# Patient Record
Sex: Male | Born: 1995
Health system: Southern US, Community
[De-identification: ages and names within clinical notes are randomized; demographics above are authoritative.]

## PROBLEM LIST (undated history)

## (undated) DIAGNOSIS — J189 Pneumonia, unspecified organism: Secondary | ICD-10-CM

## (undated) DIAGNOSIS — F319 Bipolar disorder, unspecified: Secondary | ICD-10-CM

## (undated) HISTORY — PX: WISDOM TOOTH EXTRACTION: SHX21

---
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
## 2016-02-28 ENCOUNTER — Encounter: Payer: BLUE CROSS/BLUE SHIELD | Admitting: Sports Medicine

## 2016-03-27 ENCOUNTER — Encounter: Payer: Self-pay | Admitting: Internal Medicine

## 2016-04-09 DIAGNOSIS — M436 Torticollis: Secondary | ICD-10-CM | POA: Diagnosis not present

## 2016-04-09 DIAGNOSIS — Z23 Encounter for immunization: Secondary | ICD-10-CM | POA: Diagnosis not present

## 2016-04-15 DIAGNOSIS — Z1389 Encounter for screening for other disorder: Secondary | ICD-10-CM | POA: Diagnosis not present

## 2016-04-15 DIAGNOSIS — Z0000 Encounter for general adult medical examination without abnormal findings: Secondary | ICD-10-CM | POA: Diagnosis not present

## 2016-04-15 DIAGNOSIS — Z6823 Body mass index (BMI) 23.0-23.9, adult: Secondary | ICD-10-CM | POA: Diagnosis not present

## 2016-04-15 DIAGNOSIS — E559 Vitamin D deficiency, unspecified: Secondary | ICD-10-CM | POA: Diagnosis not present

## 2016-04-16 DIAGNOSIS — Z1283 Encounter for screening for malignant neoplasm of skin: Secondary | ICD-10-CM | POA: Diagnosis not present

## 2016-04-16 DIAGNOSIS — D225 Melanocytic nevi of trunk: Secondary | ICD-10-CM | POA: Diagnosis not present

## 2016-04-16 DIAGNOSIS — D485 Neoplasm of uncertain behavior of skin: Secondary | ICD-10-CM | POA: Diagnosis not present

## 2016-09-09 DIAGNOSIS — K529 Noninfective gastroenteritis and colitis, unspecified: Secondary | ICD-10-CM | POA: Diagnosis not present

## 2016-09-09 DIAGNOSIS — R1032 Left lower quadrant pain: Secondary | ICD-10-CM | POA: Diagnosis not present

## 2016-09-09 DIAGNOSIS — R1013 Epigastric pain: Secondary | ICD-10-CM | POA: Diagnosis not present

## 2016-09-09 DIAGNOSIS — R509 Fever, unspecified: Secondary | ICD-10-CM | POA: Diagnosis not present

## 2016-10-15 DIAGNOSIS — N4889 Other specified disorders of penis: Secondary | ICD-10-CM | POA: Diagnosis not present

## 2016-10-27 DIAGNOSIS — F4323 Adjustment disorder with mixed anxiety and depressed mood: Secondary | ICD-10-CM | POA: Diagnosis not present

## 2016-11-12 DIAGNOSIS — D485 Neoplasm of uncertain behavior of skin: Secondary | ICD-10-CM | POA: Diagnosis not present

## 2017-01-12 DIAGNOSIS — R45851 Suicidal ideations: Secondary | ICD-10-CM | POA: Diagnosis not present

## 2017-01-12 DIAGNOSIS — F329 Major depressive disorder, single episode, unspecified: Secondary | ICD-10-CM | POA: Diagnosis not present

## 2017-01-13 DIAGNOSIS — R45851 Suicidal ideations: Secondary | ICD-10-CM | POA: Diagnosis not present

## 2017-01-13 DIAGNOSIS — F331 Major depressive disorder, recurrent, moderate: Secondary | ICD-10-CM | POA: Diagnosis not present

## 2017-01-13 DIAGNOSIS — F419 Anxiety disorder, unspecified: Secondary | ICD-10-CM | POA: Diagnosis not present

## 2017-01-14 DIAGNOSIS — R45851 Suicidal ideations: Secondary | ICD-10-CM | POA: Diagnosis not present

## 2017-01-14 DIAGNOSIS — F331 Major depressive disorder, recurrent, moderate: Secondary | ICD-10-CM | POA: Diagnosis not present

## 2017-01-15 DIAGNOSIS — R45851 Suicidal ideations: Secondary | ICD-10-CM | POA: Diagnosis not present

## 2017-01-15 DIAGNOSIS — F331 Major depressive disorder, recurrent, moderate: Secondary | ICD-10-CM | POA: Diagnosis not present

## 2017-01-16 DIAGNOSIS — R45851 Suicidal ideations: Secondary | ICD-10-CM | POA: Diagnosis not present

## 2017-01-16 DIAGNOSIS — F331 Major depressive disorder, recurrent, moderate: Secondary | ICD-10-CM | POA: Diagnosis not present

## 2017-01-28 DIAGNOSIS — F319 Bipolar disorder, unspecified: Secondary | ICD-10-CM | POA: Diagnosis not present

## 2017-01-30 DIAGNOSIS — F329 Major depressive disorder, single episode, unspecified: Secondary | ICD-10-CM | POA: Diagnosis not present

## 2017-01-30 DIAGNOSIS — F419 Anxiety disorder, unspecified: Secondary | ICD-10-CM | POA: Diagnosis not present

## 2017-01-30 DIAGNOSIS — Z6824 Body mass index (BMI) 24.0-24.9, adult: Secondary | ICD-10-CM | POA: Diagnosis not present

## 2017-01-30 DIAGNOSIS — T1491XS Suicide attempt, sequela: Secondary | ICD-10-CM | POA: Diagnosis not present

## 2017-03-10 ENCOUNTER — Ambulatory Visit (HOSPITAL_COMMUNITY): Payer: BLUE CROSS/BLUE SHIELD | Admitting: Psychiatry

## 2017-03-29 ENCOUNTER — Emergency Department (HOSPITAL_COMMUNITY)
Admission: EM | Admit: 2017-03-29 | Discharge: 2017-03-29 | Disposition: A | Discharge status: Home / Self Care | Payer: BLUE CROSS/BLUE SHIELD | Attending: Emergency Medicine | Admitting: Emergency Medicine

## 2017-03-29 ENCOUNTER — Emergency Department (HOSPITAL_COMMUNITY): Payer: BLUE CROSS/BLUE SHIELD

## 2017-03-29 ENCOUNTER — Encounter (HOSPITAL_COMMUNITY): Payer: Self-pay | Admitting: Emergency Medicine

## 2017-03-29 DIAGNOSIS — K2100 Gastro-esophageal reflux disease with esophagitis, without bleeding: Secondary | ICD-10-CM

## 2017-03-29 DIAGNOSIS — R1010 Upper abdominal pain, unspecified: Secondary | ICD-10-CM | POA: Diagnosis not present

## 2017-03-29 DIAGNOSIS — R197 Diarrhea, unspecified: Secondary | ICD-10-CM

## 2017-03-29 DIAGNOSIS — R1012 Left upper quadrant pain: Secondary | ICD-10-CM

## 2017-03-29 DIAGNOSIS — R1013 Epigastric pain: Secondary | ICD-10-CM | POA: Diagnosis not present

## 2017-03-29 DIAGNOSIS — Z79899 Other long term (current) drug therapy: Secondary | ICD-10-CM | POA: Insufficient documentation

## 2017-03-29 DIAGNOSIS — K219 Gastro-esophageal reflux disease without esophagitis: Secondary | ICD-10-CM | POA: Diagnosis not present

## 2017-03-29 DIAGNOSIS — R112 Nausea with vomiting, unspecified: Secondary | ICD-10-CM

## 2017-03-29 LAB — URINALYSIS, ROUTINE W REFLEX MICROSCOPIC
Bilirubin Urine: NEGATIVE
Glucose, UA: NEGATIVE mg/dL
Ketones, ur: NEGATIVE mg/dL
Leukocytes, UA: NEGATIVE
Nitrite: NEGATIVE
Protein, ur: NEGATIVE mg/dL
Specific Gravity, Urine: 1.006 (ref 1.005–1.030)
Squamous Epithelial / LPF: NONE SEEN
pH: 7 (ref 5.0–8.0)

## 2017-03-29 LAB — COMPREHENSIVE METABOLIC PANEL
ALT: 32 U/L (ref 17–63)
AST: 55 U/L — ABNORMAL HIGH (ref 15–41)
Albumin: 4.8 g/dL (ref 3.5–5.0)
Alkaline Phosphatase: 75 U/L (ref 38–126)
Anion gap: 8 (ref 5–15)
BUN: 8 mg/dL (ref 6–20)
CO2: 27 mmol/L (ref 22–32)
Calcium: 9.7 mg/dL (ref 8.9–10.3)
Chloride: 103 mmol/L (ref 101–111)
Creatinine, Ser: 0.84 mg/dL (ref 0.61–1.24)
GFR calc Af Amer: >60 mL/min (ref >60)
GFR calc non Af Amer: >60 mL/min (ref >60)
Glucose, Bld: 110 mg/dL — ABNORMAL HIGH (ref 65–99)
Potassium: 3.6 mmol/L (ref 3.5–5.1)
Sodium: 138 mmol/L (ref 135–145)
Total Bilirubin: 0.9 mg/dL (ref 0.3–1.2)
Total Protein: 7.9 g/dL (ref 6.5–8.1)

## 2017-03-29 LAB — CBC
HCT: 45.6 % (ref 39.0–52.0)
Hemoglobin: 16.6 g/dL (ref 13.0–17.0)
MCH: 30.9 pg (ref 26.0–34.0)
MCHC: 36.4 g/dL — ABNORMAL HIGH (ref 30.0–36.0)
MCV: 84.8 fL (ref 78.0–100.0)
Platelets: 177 10*3/uL (ref 150–400)
RBC: 5.38 MIL/uL (ref 4.22–5.81)
RDW: 12.2 % (ref 11.5–15.5)
WBC: 8 10*3/uL (ref 4.0–10.5)

## 2017-03-29 LAB — LIPASE, BLOOD: Lipase: 20 U/L (ref 11–51)

## 2017-03-29 MED ORDER — ONDANSETRON HCL 4 MG/2ML IJ SOLN
4.0000 mg | Freq: Once | INTRAMUSCULAR | Status: DC
  Filled 2017-03-29: qty 2

## 2017-03-29 MED ORDER — SODIUM CHLORIDE 0.9 % IV BOLUS (SEPSIS)
1000.0000 mL | Freq: Once | INTRAVENOUS | Status: AC
  Administered 2017-03-29: 1000 mL via INTRAVENOUS

## 2017-03-29 MED ORDER — OMEPRAZOLE 20 MG PO CPDR: 20.0000 mg | DELAYED_RELEASE_CAPSULE | Freq: Every day | ORAL | 0 refills | Status: DC

## 2017-03-29 MED ORDER — ONDANSETRON 4 MG PO TBDP: 4.0000 mg | ORAL_TABLET | Freq: Three times a day (TID) | ORAL | 0 refills | Status: DC | PRN

## 2017-03-29 MED ORDER — GI COCKTAIL ~~LOC~~
30.0000 mL | Freq: Once | ORAL | Status: DC
  Filled 2017-03-29: qty 30

## 2017-03-29 NOTE — ED Provider Notes (Signed)
[facility]-EMERGENCY DEPT Provider Note   CSN: [id number] Arrival date & time: 03/29/17  1132     History   Chief Complaint Chief Complaint  Patient presents with  . Abdominal Pain    HPI Keith Guerrero is a 21 y.o. male.  Keith Guerrero is a 21 y.o. Male who presents to the ED complaining of 2 days of epigastric abdominal pain. He reports associated nausea, burping, belching and acid reflux in his mouth. He reports having vomiting some acid from his stomach today. He also reports a loose stool this morning. No hematemesis or hematochezia. No previous abdominal surgeries. No fevers. He denies right upper quadrant abdominal pain. He denies lower abdominal pain. He denies fevers, hematemesis, hematochezia, urinary symptoms, penile pain, testicular pain, back pain, chest pain or shortness of breath.   The history is provided by the patient, a parent and medical records. No language interpreter was used.  Abdominal Pain   Associated symptoms include diarrhea, nausea and vomiting. Pertinent negatives include fever, dysuria, frequency, hematuria and headaches.    Past Medical History:  Diagnosis Date  . Pneumonia     Patient Active Problem List   Diagnosis Date Noted  . Adjustment disorder with anxious mood 06/30/2015  . Chest pain, midsternal 03/22/2015  . Anxiety attack 03/22/2015  . Anxiety 03/22/2015  . Right foot pain 05/18/2014  . Fever, unspecified 05/18/2014  . Viral syndrome 05/17/2014  . BMI (body mass index), pediatric, 5% to less than 85% for age 12/25/2012  . LEG LEGNTH DISCREPANCY 04/13/2007  . PES PLANUS, CONGENITAL 04/13/2007  . DEFORMITY, FOOT NEC, CONGENITAL 04/13/2007    Past Surgical History:  Procedure Laterality Date  . WISDOM TOOTH EXTRACTION         Home Medications    Prior to Admission medications   Medication Sig Start Date End Date Taking? Authorizing Provider  omeprazole (PRILOSEC) 20 MG capsule Take 1 capsule (20 mg total) by mouth daily.  03/29/17   Everlene Farrieransie, Lakeysha Slutsky, PA-C  ondansetron (ZOFRAN ODT) 4 MG disintegrating tablet Take 1 tablet (4 mg total) by mouth every 8 (eight) hours as needed for nausea or vomiting. 03/29/17   Everlene Farrieransie, Jewett Mcgann, PA-C    Family History Family History  Problem Relation Age of Onset  . Hypertension Other     Social History Social History  Substance Use Topics  . Smoking status: Never Smoker  . Smokeless tobacco: Never Used  . Alcohol use No     Allergies   Fish allergy and Other   Review of Systems Review of Systems  Constitutional: Negative for chills and fever.  HENT: Negative for congestion and sore throat.   Eyes: Negative for visual disturbance.  Respiratory: Negative for cough, shortness of breath and wheezing.   Cardiovascular: Negative for chest pain and palpitations.  Gastrointestinal: Positive for abdominal pain, diarrhea, nausea and vomiting. Negative for blood in stool.  Genitourinary: Negative for decreased urine volume, difficulty urinating, dysuria, flank pain, frequency, hematuria, penile pain, testicular pain and urgency.  Musculoskeletal: Negative for back pain and neck pain.  Skin: Negative for rash.  Neurological: Negative for headaches.     Physical Exam Updated Vital Signs BP 124/82 (BP Location: Left Arm)   Pulse 65   Temp 97.8 F (36.6 C) (Oral)   Resp 20   Wt 70.3 kg   SpO2 100%   BMI 23.92 kg/m   Physical Exam  Constitutional: He appears well-developed and well-nourished. No distress.  Nontoxic appearing.  HENT:  Head: Normocephalic  and atraumatic.  Mouth/Throat: Oropharynx is clear and moist.  Eyes: Conjunctivae are normal. Pupils are equal, round, and reactive to light. Right eye exhibits no discharge. Left eye exhibits no discharge.  Neck: Neck supple.  Cardiovascular: Normal rate, regular rhythm, normal heart sounds and intact distal pulses.  Exam reveals no gallop and no friction rub.   No murmur heard. Pulmonary/Chest: Effort normal and  breath sounds normal. No respiratory distress. He has no wheezes. He has no rales.  Abdominal: Soft. Bowel sounds are normal. He exhibits no distension and no mass. There is tenderness. There is no rebound and no guarding. No hernia.  Abdomen is soft. Bowel sounds are present. Patient has epigastric and left upper quadrant abdominal tenderness to palpation. No right upper quadrant tenderness. No peritoneal signs. No psoas or obturator sign. No CVA or flank tenderness.  Musculoskeletal: He exhibits no edema.  Lymphadenopathy:    He has no cervical adenopathy.  Neurological: He is alert. Coordination normal.  Skin: Skin is warm and dry. Capillary refill takes less than 2 seconds. No rash noted. He is not diaphoretic. No erythema. No pallor.  Psychiatric: He has a normal mood and affect. His behavior is normal.  Nursing note and vitals reviewed.    ED Treatments / Results  Labs (all labs ordered are listed, but only abnormal results are displayed) Labs Reviewed  COMPREHENSIVE METABOLIC PANEL - Abnormal; Notable for the following:       Result Value   Glucose, Bld 110 (*)    AST 55 (*)    All other components within normal limits  CBC - Abnormal; Notable for the following:    MCHC 36.4 (*)    All other components within normal limits  URINALYSIS, ROUTINE W REFLEX MICROSCOPIC - Abnormal; Notable for the following:    Hgb urine dipstick MODERATE (*)    Bacteria, UA RARE (*)    All other components within normal limits  LIPASE, BLOOD    EKG  EKG Interpretation None       Radiology US Abdomen Limited  Result Date: 03/29/2017 CLINICAL DATA:  Epigastric pain for 2 days, elevated liver function test. EXAM: US ABDOMEN LIMITED - RIGHT UPPER QUADRANT COMPARISON:  None. FINDINGS: Gallbladder: No gallstones or wall thickening visualized. No sonographic Murphy sign noted by sonographer. Common bile duct: Diameter: 2 mm Liver: No focal lesion identified. Within normal limits in parenchymal  echogenicity. IMPRESSION: Normal right upper quadrant ultrasound. Electronically Signed   By: Bary Richard M.D.   On: 03/29/2017 14:45    Procedures Procedures (including critical care time)  Medications Ordered in ED Medications  ondansetron (ZOFRAN) injection 4 mg (not administered)  gi cocktail (Maalox,Lidocaine,Donnatal) (not administered)  sodium chloride 0.9 % bolus 1,000 mL (0 mLs Intravenous Stopped 03/29/17 1433)     Initial Impression / Assessment and Plan / ED Course  I have reviewed the triage vital signs and the nursing notes.  Pertinent labs & imaging results that were available during my care of the patient were reviewed by me and considered in my medical decision making (see chart for details).    This is a 21 y.o. Male who presents to the ED complaining of 2 days of epigastric abdominal pain. He reports associated nausea, burping, belching and acid reflux in his mouth. He reports having vomiting some acid from his stomach today. He also reports a loose stool this morning. No hematemesis or hematochezia. No previous abdominal surgeries. No fevers. He denies right upper quadrant abdominal pain.  He denies lower abdominal pain. On exam the patient is afebrile nontoxic appearing. His abdomen is soft and he has mild epigastric and left upper quadrant abdominal tenderness to palpation. No peritoneal signs. No CVA or flank tenderness. Lipase is within normal limits. CMP is remarkable only for very mildly elevated AST at 55. Preserved kidney function. CBC is unremarkable. No leukocytosis. Urinalysis is remarkable only for microscopic moderate hematuria. No evidence of infection. Patient without urinary symptoms. No lower abdominal pain. Right upper quadrant abdominal ultrasound was obtained. This was unremarkable. Normal gallbladder. Normal liver. Patient received GI cocktail only. He declined Zofran. He reports his pain and nausea completely resolved with only GI cocktail. This is  reassuring. I suspect peptic ulcer versus acid reflux. Will discharge with prescription for Zofran to use if needed and omeprazole. I discussed strict and specific return precautions. I advised the patient to follow-up with their primary care provider this week. I advised the patient to return to the emergency department with new or worsening symptoms or new concerns. The patient and his mother verbalized understanding and agreement with plan.     Final Clinical Impressions(s) / ED Diagnoses   Final diagnoses:  Nausea vomiting and diarrhea  Epigastric pain  Left upper quadrant pain  Gastroesophageal reflux disease with esophagitis    New Prescriptions New Prescriptions   OMEPRAZOLE (PRILOSEC) 20 MG CAPSULE    Take 1 capsule (20 mg total) by mouth daily.   ONDANSETRON (ZOFRAN ODT) 4 MG DISINTEGRATING TABLET    Take 1 tablet (4 mg total) by mouth every 8 (eight) hours as needed for nausea or vomiting.     Everlene Farrier, PA-C 03/29/17 Joelyn Oms    Jerelyn Scott, MD 03/29/17 [phone]

## 2017-03-29 NOTE — ED Triage Notes (Signed)
Pt reports 2 day hx of stabbing upper abdominal pain and epigastric pain. Denies N/V. 2 loose stools this am-denies blood or mucus in stools. Pt was need at [facility] this am and referred to ED for labs and further evaluation. Pt is alert , oriented and ambulatory. Mother at bedside

## 2017-03-29 NOTE — ED Notes (Addendum)
Patient stated he was not wanting zofran or GI cocktail at moment. Patient aware of needing urine specimen.

## 2017-04-16 DIAGNOSIS — Z0000 Encounter for general adult medical examination without abnormal findings: Secondary | ICD-10-CM | POA: Diagnosis not present

## 2017-04-20 DIAGNOSIS — Z915 Personal history of self-harm: Secondary | ICD-10-CM | POA: Diagnosis not present

## 2017-04-20 DIAGNOSIS — F418 Other specified anxiety disorders: Secondary | ICD-10-CM | POA: Diagnosis not present

## 2017-04-20 DIAGNOSIS — Z1389 Encounter for screening for other disorder: Secondary | ICD-10-CM | POA: Diagnosis not present

## 2017-04-20 DIAGNOSIS — Z0000 Encounter for general adult medical examination without abnormal findings: Secondary | ICD-10-CM | POA: Diagnosis not present

## 2017-04-20 DIAGNOSIS — F3289 Other specified depressive episodes: Secondary | ICD-10-CM | POA: Diagnosis not present

## 2017-04-20 DIAGNOSIS — N4889 Other specified disorders of penis: Secondary | ICD-10-CM | POA: Diagnosis not present

## 2017-05-11 DIAGNOSIS — L501 Idiopathic urticaria: Secondary | ICD-10-CM | POA: Diagnosis not present

## 2017-05-11 DIAGNOSIS — Z91018 Allergy to other foods: Secondary | ICD-10-CM | POA: Diagnosis not present

## 2017-05-14 DIAGNOSIS — R102 Pelvic and perineal pain: Secondary | ICD-10-CM | POA: Diagnosis not present

## 2017-06-19 DIAGNOSIS — Z1283 Encounter for screening for malignant neoplasm of skin: Secondary | ICD-10-CM | POA: Diagnosis not present

## 2017-06-19 DIAGNOSIS — L989 Disorder of the skin and subcutaneous tissue, unspecified: Secondary | ICD-10-CM | POA: Diagnosis not present

## 2017-06-19 DIAGNOSIS — D485 Neoplasm of uncertain behavior of skin: Secondary | ICD-10-CM | POA: Diagnosis not present

## 2017-06-19 DIAGNOSIS — D225 Melanocytic nevi of trunk: Secondary | ICD-10-CM | POA: Diagnosis not present

## 2017-06-30 DIAGNOSIS — M799 Soft tissue disorder, unspecified: Secondary | ICD-10-CM | POA: Diagnosis not present

## 2017-06-30 DIAGNOSIS — F418 Other specified anxiety disorders: Secondary | ICD-10-CM | POA: Diagnosis not present

## 2017-06-30 DIAGNOSIS — R499 Unspecified voice and resonance disorder: Secondary | ICD-10-CM | POA: Diagnosis not present

## 2017-06-30 DIAGNOSIS — R062 Wheezing: Secondary | ICD-10-CM | POA: Diagnosis not present

## 2017-08-19 DIAGNOSIS — E049 Nontoxic goiter, unspecified: Secondary | ICD-10-CM | POA: Diagnosis not present

## 2017-08-19 DIAGNOSIS — R062 Wheezing: Secondary | ICD-10-CM | POA: Diagnosis not present

## 2017-08-20 DIAGNOSIS — E049 Nontoxic goiter, unspecified: Secondary | ICD-10-CM | POA: Diagnosis not present

## 2017-11-02 DIAGNOSIS — J069 Acute upper respiratory infection, unspecified: Secondary | ICD-10-CM | POA: Diagnosis not present

## 2017-11-02 DIAGNOSIS — R05 Cough: Secondary | ICD-10-CM | POA: Diagnosis not present

## 2017-11-02 DIAGNOSIS — J029 Acute pharyngitis, unspecified: Secondary | ICD-10-CM | POA: Diagnosis not present

## 2017-11-02 DIAGNOSIS — J040 Acute laryngitis: Secondary | ICD-10-CM | POA: Diagnosis not present

## 2017-11-05 DIAGNOSIS — R061 Stridor: Secondary | ICD-10-CM | POA: Diagnosis not present

## 2017-11-05 DIAGNOSIS — R0989 Other specified symptoms and signs involving the circulatory and respiratory systems: Secondary | ICD-10-CM | POA: Diagnosis not present

## 2017-11-12 DIAGNOSIS — D225 Melanocytic nevi of trunk: Secondary | ICD-10-CM | POA: Diagnosis not present

## 2018-03-25 DIAGNOSIS — R05 Cough: Secondary | ICD-10-CM | POA: Diagnosis not present

## 2018-03-25 DIAGNOSIS — J383 Other diseases of vocal cords: Secondary | ICD-10-CM | POA: Diagnosis not present

## 2018-03-25 DIAGNOSIS — L501 Idiopathic urticaria: Secondary | ICD-10-CM | POA: Diagnosis not present

## 2018-03-25 DIAGNOSIS — Z91018 Allergy to other foods: Secondary | ICD-10-CM | POA: Diagnosis not present

## 2018-06-01 DIAGNOSIS — Z1283 Encounter for screening for malignant neoplasm of skin: Secondary | ICD-10-CM | POA: Diagnosis not present

## 2018-06-01 DIAGNOSIS — L821 Other seborrheic keratosis: Secondary | ICD-10-CM | POA: Diagnosis not present

## 2018-09-03 DIAGNOSIS — Z1389 Encounter for screening for other disorder: Secondary | ICD-10-CM | POA: Diagnosis not present

## 2018-09-03 DIAGNOSIS — E559 Vitamin D deficiency, unspecified: Secondary | ICD-10-CM | POA: Diagnosis not present

## 2018-09-03 DIAGNOSIS — Z681 Body mass index (BMI) 19 or less, adult: Secondary | ICD-10-CM | POA: Diagnosis not present

## 2018-09-03 DIAGNOSIS — Z0000 Encounter for general adult medical examination without abnormal findings: Secondary | ICD-10-CM | POA: Diagnosis not present

## 2018-09-03 DIAGNOSIS — F325 Major depressive disorder, single episode, in full remission: Secondary | ICD-10-CM | POA: Diagnosis not present

## 2018-10-25 DIAGNOSIS — F329 Major depressive disorder, single episode, unspecified: Secondary | ICD-10-CM | POA: Diagnosis not present

## 2018-10-25 DIAGNOSIS — Z8659 Personal history of other mental and behavioral disorders: Secondary | ICD-10-CM | POA: Diagnosis not present

## 2018-10-25 DIAGNOSIS — Z915 Personal history of self-harm: Secondary | ICD-10-CM | POA: Diagnosis not present

## 2018-10-25 DIAGNOSIS — R45851 Suicidal ideations: Secondary | ICD-10-CM | POA: Diagnosis not present

## 2018-10-26 DIAGNOSIS — Z915 Personal history of self-harm: Secondary | ICD-10-CM | POA: Diagnosis not present

## 2018-10-26 DIAGNOSIS — Z8659 Personal history of other mental and behavioral disorders: Secondary | ICD-10-CM | POA: Diagnosis not present

## 2018-10-26 DIAGNOSIS — R45851 Suicidal ideations: Secondary | ICD-10-CM | POA: Diagnosis not present

## 2019-06-13 DIAGNOSIS — D225 Melanocytic nevi of trunk: Secondary | ICD-10-CM | POA: Diagnosis not present

## 2019-06-13 DIAGNOSIS — Z1283 Encounter for screening for malignant neoplasm of skin: Secondary | ICD-10-CM | POA: Diagnosis not present

## 2020-04-28 DIAGNOSIS — S81852A Open bite, left lower leg, initial encounter: Secondary | ICD-10-CM | POA: Diagnosis not present

## 2020-04-28 DIAGNOSIS — Z23 Encounter for immunization: Secondary | ICD-10-CM | POA: Diagnosis not present

## 2020-04-28 DIAGNOSIS — R2241 Localized swelling, mass and lump, right lower limb: Secondary | ICD-10-CM | POA: Diagnosis not present

## 2020-04-28 DIAGNOSIS — W540XXA Bitten by dog, initial encounter: Secondary | ICD-10-CM | POA: Diagnosis not present

## 2020-04-30 DIAGNOSIS — S81852S Open bite, left lower leg, sequela: Secondary | ICD-10-CM | POA: Diagnosis not present

## 2020-04-30 DIAGNOSIS — R519 Headache, unspecified: Secondary | ICD-10-CM | POA: Diagnosis not present

## 2020-05-04 DIAGNOSIS — S81852S Open bite, left lower leg, sequela: Secondary | ICD-10-CM | POA: Diagnosis not present

## 2020-05-04 DIAGNOSIS — L03119 Cellulitis of unspecified part of limb: Secondary | ICD-10-CM | POA: Diagnosis not present

## 2020-07-16 DIAGNOSIS — R202 Paresthesia of skin: Secondary | ICD-10-CM | POA: Diagnosis not present

## 2020-08-03 ENCOUNTER — Encounter (HOSPITAL_COMMUNITY): Payer: Self-pay

## 2020-08-03 ENCOUNTER — Emergency Department (HOSPITAL_COMMUNITY)
Admission: EM | Admit: 2020-08-03 | Discharge: 2020-08-04 | Disposition: A | Discharge status: Other Acute Inpatient Hospital | Payer: BC Managed Care – PPO | Source: Home / Self Care | Attending: Emergency Medicine | Admitting: Emergency Medicine

## 2020-08-03 ENCOUNTER — Other Ambulatory Visit: Payer: Self-pay

## 2020-08-03 DIAGNOSIS — R9431 Abnormal electrocardiogram [ECG] [EKG]: Secondary | ICD-10-CM | POA: Diagnosis not present

## 2020-08-03 DIAGNOSIS — R569 Unspecified convulsions: Secondary | ICD-10-CM | POA: Diagnosis not present

## 2020-08-03 DIAGNOSIS — Z560 Unemployment, unspecified: Secondary | ICD-10-CM | POA: Diagnosis not present

## 2020-08-03 DIAGNOSIS — R45851 Suicidal ideations: Secondary | ICD-10-CM | POA: Diagnosis not present

## 2020-08-03 DIAGNOSIS — Z8249 Family history of ischemic heart disease and other diseases of the circulatory system: Secondary | ICD-10-CM | POA: Diagnosis not present

## 2020-08-03 DIAGNOSIS — Z91013 Allergy to seafood: Secondary | ICD-10-CM | POA: Diagnosis not present

## 2020-08-03 DIAGNOSIS — F419 Anxiety disorder, unspecified: Secondary | ICD-10-CM | POA: Diagnosis not present

## 2020-08-03 DIAGNOSIS — I4510 Unspecified right bundle-branch block: Secondary | ICD-10-CM | POA: Diagnosis not present

## 2020-08-03 DIAGNOSIS — F22 Delusional disorders: Secondary | ICD-10-CM

## 2020-08-03 DIAGNOSIS — Z20822 Contact with and (suspected) exposure to covid-19: Secondary | ICD-10-CM | POA: Insufficient documentation

## 2020-08-03 DIAGNOSIS — F319 Bipolar disorder, unspecified: Secondary | ICD-10-CM | POA: Diagnosis not present

## 2020-08-03 DIAGNOSIS — R443 Hallucinations, unspecified: Secondary | ICD-10-CM | POA: Insufficient documentation

## 2020-08-03 DIAGNOSIS — F6381 Intermittent explosive disorder: Secondary | ICD-10-CM

## 2020-08-03 LAB — RAPID URINE DRUG SCREEN, HOSP PERFORMED
Amphetamines: NOT DETECTED
Barbiturates: NOT DETECTED
Benzodiazepines: NOT DETECTED
Cocaine: NOT DETECTED
Opiates: NOT DETECTED
Tetrahydrocannabinol: NOT DETECTED

## 2020-08-03 LAB — COMPREHENSIVE METABOLIC PANEL
ALT: 15 U/L (ref 0–44)
AST: 20 U/L (ref 15–41)
Albumin: 5.2 g/dL — ABNORMAL HIGH (ref 3.5–5.0)
Alkaline Phosphatase: 56 U/L (ref 38–126)
Anion gap: 11 (ref 5–15)
BUN: 18 mg/dL (ref 6–20)
CO2: 26 mmol/L (ref 22–32)
Calcium: 10.2 mg/dL (ref 8.9–10.3)
Chloride: 101 mmol/L (ref 98–111)
Creatinine, Ser: 0.82 mg/dL (ref 0.61–1.24)
GFR calc Af Amer: >60 mL/min (ref >60)
GFR calc non Af Amer: >60 mL/min (ref >60)
Glucose, Bld: 111 mg/dL — ABNORMAL HIGH (ref 70–99)
Potassium: 3.9 mmol/L (ref 3.5–5.1)
Sodium: 138 mmol/L (ref 135–145)
Total Bilirubin: 0.7 mg/dL (ref 0.3–1.2)
Total Protein: 8.5 g/dL — ABNORMAL HIGH (ref 6.5–8.1)

## 2020-08-03 LAB — CBC
HCT: 45.4 % (ref 39.0–52.0)
Hemoglobin: 15.7 g/dL (ref 13.0–17.0)
MCH: 30.4 pg (ref 26.0–34.0)
MCHC: 34.6 g/dL (ref 30.0–36.0)
MCV: 87.8 fL (ref 80.0–100.0)
Platelets: 198 10*3/uL (ref 150–400)
RBC: 5.17 MIL/uL (ref 4.22–5.81)
RDW: 11.9 % (ref 11.5–15.5)
WBC: 8 10*3/uL (ref 4.0–10.5)
nRBC: 0 % (ref 0.0–0.2)

## 2020-08-03 LAB — SALICYLATE LEVEL: Salicylate Lvl: <7 mg/dL — ABNORMAL LOW (ref 7.0–30.0)

## 2020-08-03 LAB — ETHANOL: Alcohol, Ethyl (B): <10 mg/dL (ref <10)

## 2020-08-03 LAB — ACETAMINOPHEN LEVEL: Acetaminophen (Tylenol), Serum: <10 ug/mL — ABNORMAL LOW (ref 10–30)

## 2020-08-03 NOTE — BH Assessment (Signed)
Per Gillermo Murdoch, NP pt meets inpatient criteria. Disposition counselor notified AC of bed placement needs. Pending review by [facility] to determine bed availability. Patient also faxed to the following facilities for consideration of bed placement.  [facility]  [facility]  [facility]  [facility]  [facility]  [facility]  [facility]  [facility]  [facility]  [facility]

## 2020-08-03 NOTE — BH Assessment (Signed)
Per Gillermo Murdoch, NP pt meets inpatient criteria. Disposition counselor notified AC of bed placement needs. Pending review by [facility] to determine bed availability.

## 2020-08-03 NOTE — BH Assessment (Signed)
Comprehensive Clinical Assessment (CCA) Note  08/03/2020 Narvel Kozub Litzau [id number]  Keith Guerrero is a 24 yo male reporting to [facility] ED transported by [facility] under IVC for suicidal ideation. Pt reports that he has had suicidal ideation and treatment in the past, including an inpatient hospitalization in 2018.  Pt denies that he has had follow-up psychiatric care after inpatient hospitalization "I don't really believe in mental health medications".  Pt endorses current SI due to chronic pain that he feels was triggered by a TDAP vaccine that he received 14 weeks ago after a dogbite. Pt denies specific plans to TTS but reported to mom he wanted to jump off parking deck and/or crash his car in traffic. Pt denies HI.  Pt denies any current AV/VH but has delusional thought process. Pt is speaking with very rapid, pressured speech. Pt speaks repeatedly about vaccinations, conspiracy theories, and finding "symbols" in everyday events.  Pt feels that he would like to work with a therapist but is very anti-medication.  Disposition: Per Gillermo Murdoch FNP pt meets inpatient criteria  Visit Diagnosis:   No diagnosis found.    CCA Screening, Triage and Referral (STR)  Patient Reported Information How did you hear about [place]? Legal System  Referral name: No data recorded Referral phone number: No data recorded  Whom do you see for routine medical problems? No data recorded Practice/Facility Name: No data recorded Practice/Facility Phone Number: No data recorded Name of Contact: No data recorded Contact Number: No data recorded Contact Fax Number: No data recorded Prescriber Name: No data recorded Prescriber Address (if known): No data recorded  What Is the Reason for Your Visit/Call Today? No data recorded How Long Has This Been Causing You Problems? > than 6 months  What Do You Feel Would Help You the Most Today? Therapy (Pt reports that he does not trust mental health medication)   Have You  Recently Been in Any Inpatient Treatment (Hospital/Detox/Crisis Center/28-Day Program)? No (Pt has had inpatient treatment in [place] in 2018 (one week post SI))  Name/Location of Program/Hospital:No data recorded How Long Were You There? No data recorded When Were You Discharged? No data recorded  Have You Ever Received Services From [facility] Before? Yes  Who Do You See at [facility]? No data recorded  Have You Recently Had Any Thoughts About Hurting Yourself? Yes (Pt reports thoughts but denies plans or intent to follow through.  Pt reported to family members plans and intent.)  Are You Planning to Commit Suicide/Harm Yourself At This time? No   Have you Recently Had Thoughts About Hurting Someone [name]? No  Explanation: No data recorded  Have You Used Any Alcohol or Drugs in the Past 24 Hours? No  How Long Ago Did You Use Drugs or Alcohol? No data recorded What Did You Use and How Much? No data recorded  Do You Currently Have a Therapist/Psychiatrist? No  Name of Therapist/Psychiatrist: No data recorded  Have You Been Recently Discharged From Any Office Practice or Programs? No  Explanation of Discharge From Practice/Program: No data recorded    CCA Screening Triage Referral Assessment Type of Contact: Tele-Assessment  Is this Initial or Reassessment? Initial Assessment  Date Telepsych consult ordered in [facility]:  08/03/20  Time Telepsych consult ordered in [facility]:  1628   Patient Reported Information Reviewed? Yes  Patient Left Without Being Seen? No data recorded Reason for Not Completing Assessment: No data recorded  Collateral Involvement: none   Does Patient Have a Court Appointed  Legal Guardian? No data recorded Name and Contact of Legal Guardian: No data recorded If Minor and Not Living with Parent(s), Who has Custody? No data recorded Is CPS involved or ever been involved? Never  Is APS involved or ever been involved? Never   Patient Determined To Be  At Risk for Harm To Self or Others Based on Review of Patient Reported Information or Presenting Complaint? Yes, for Self-Harm (Pt IVC by mother--brought to hospital with [employer] officers x 2)  Method: No data recorded Availability of Means: No data recorded Intent: No data recorded Notification Required: No data recorded Additional Information for Danger to Others Potential: No data recorded Additional Comments for Danger to Others Potential: No data recorded Are There Guns or Other Weapons in Your Home? No data recorded Types of Guns/Weapons: No data recorded Are These Weapons Safely Secured?                            No data recorded Who Could Verify You Are Able To Have These Secured: No data recorded Do You Have any Outstanding Charges, Pending Court Dates, Parole/Probation? No data recorded Contacted To Inform of Risk of Harm To Self or Others: No data recorded  Location of Assessment: [facility] ED   Does Patient Present under Involuntary Commitment? Yes  IVC Papers Initial File Date: 08/03/20   [place] of Residence: [place]   Patient Currently Receiving the Following Services: Not Receiving Services   Determination of Need: No data recorded  Options For Referral: No data recorded    CCA Biopsychosocial  Intake/Chief Complaint:  CCA Intake With Chief Complaint CCA Part Two Date: 08/03/20 CCA Part Two Time: 1830 Patient's Currently Reported Symptoms/Problems: Pt reports escalating anxiety after receiving vaccine (TDAP) after a dog bite 14 weeks ago. Pt feels "paresthesias" at the location site that has gone into his brain. Pt feels doctors are dismissing his perspective of the situation. Pt reports that he is in pain daily, which is triggering suicidal ideation. Pt denies specific plans at time of assessment, but pt disclosed plans (jumping off parking deck, crashing car) to his mother prior to IVC paperwork. Pt has history of SI and has had an inpatient hospitalization in 2018 in  [place]. Individual's Strengths: good family support;  Mental Health Symptoms Depression:  Depression: Difficulty Concentrating, Irritability, Sleep (too much or little), Tearfulness, Duration of symptoms greater than two weeks (frequent waking, situational insomnia)  Mania:  Mania: Change in energy/activity, Increased Energy, Overconfidence, Racing thoughts (rapid, pressured speech)  Anxiety:   Anxiety: Difficulty concentrating, Irritability, Sleep, Tension, Worrying  Psychosis:  Psychosis: Grossly disorganized speech, Grossly disorganized or catatonic behavior, Delusions  Trauma:  Trauma: Re-experience of traumatic event (flashbacks of dog bite/TDAP vaccine)  Obsessions:  Obsessions: Disrupts routine/functioning, Poor insight, Recurrent & persistent thoughts/impulses/images (conspiracy theories, grandiosity, bizarre beliefs about "symbols")  Compulsions:  Compulsions: Absent insight/delusional, Intrusive/time consuming, Disrupts with routine/functioning  Inattention:  Inattention: N/A  Hyperactivity/Impulsivity:  Hyperactivity/Impulsivity: Talks excessively  Oppositional/Defiant Behaviors:  Oppositional/Defiant Behaviors: None  Emotional Irregularity:  Emotional Irregularity: None  Other Mood/Personality Symptoms:      Mental Status Exam Appearance and self-care  Stature:  Stature: Average  Weight:  Weight: Average weight  Clothing:  Clothing: Neat/clean  Grooming:  Grooming: Well-groomed  Cosmetic use:  Cosmetic Use: None  Posture/gait:  Posture/Gait: Tense  Motor activity:  Motor Activity: Not Remarkable  Sensorium  Attention:  Attention: Distractible, Persistent  Concentration:  Concentration: Focuses on irrelevancies,  Preoccupied, Scattered  Orientation:  Orientation: X5  Recall/memory:  Recall/Memory: Normal  Affect and Mood  Affect:  Affect: Anxious  Mood:  Mood: Anxious, Hypomania  Relating  Eye contact:  Eye Contact: Normal  Facial expression:  Facial Expression:  Responsive  Attitude toward examiner:  Attitude Toward Examiner: Cooperative, Manipulative  Thought and Language  Speech flow: Speech Flow: Flight of Ideas, Pressured, Garbled, Loud  Thought content:  Thought Content: Suspicious, Delusions  Preoccupation:  Preoccupations: Obsessions, Religion, Ruminations (vaccines, conspiracy theory, hidden signals/"signs")  Hallucinations:  Hallucinations: None  Organization:     Company secretary of Knowledge:  Fund of Knowledge: Good  Intelligence:  Intelligence: Above Average  Abstraction:  Abstraction: Normal  Judgement:  Judgement: Dangerous  Reality Testing:  Reality Testing: Distorted  Insight:  Insight: Denial, Gaps, Shallow  Decision Making:  Decision Making: Impulsive  Social Functioning  Social Maturity:  Social Maturity: Impulsive, Irresponsible  Social Judgement:  Social Judgement: Heedless  Stress  Stressors:  Stressors: Grief/losses, Relationship, School, Transitions (recent breakup; decided not to attend grad school due to covid)  Coping Ability:  Coping Ability: Deficient supports  Skill Deficits:  Skill Deficits: Decision making  Supports:  Supports: Warehouse manager, Family     Religion: Religion/Spirituality Are You A Religious Person?: Yes How Might This Affect Treatment?: spiritual  Leisure/Recreation: Leisure / Recreation Do You Have Hobbies?: No  Exercise/Diet: Exercise/Diet Do You Exercise?: No Have You Gained or Lost A Significant Amount of Weight in the Past Six Months?: No Do You Follow a Special Diet?: No Do You Have Any Trouble Sleeping?: Yes Explanation of Sleeping Difficulties: frequent waking   CCA Employment/Education  Employment/Work Situation:    Education: Transport planner from [employer]    CCA Family/Childhood History  Family and Relationship History: Family history What is your sexual orientation?: heterosexual Does patient have children?: No  Childhood History:  Childhood History By whom  was/is the patient raised?: Both parents Additional childhood history information: stable relationship with parents Description of patient's relationship with caregiver when they were a child: stable with mother--pt reports father was emotionally harsh Does patient have siblings?:  Rich Reining) Did patient suffer any verbal/emotional/physical/sexual abuse as a child?: No Did patient suffer from severe childhood neglect?: No Has patient ever been sexually abused/assaulted/raped as an adolescent or adult?: No Was the patient ever a victim of a crime or a disaster?: No Witnessed domestic violence?:  (uta) Has patient been affected by domestic violence as an adult?:  (uta)   CCA Substance Use  Alcohol/Drug Use: Alcohol / Drug Use History of alcohol / drug use?: No history of alcohol / drug abuse (pt denies drug use and social etoh "religious drinking")    ASAM's:  Six Dimensions of Multidimensional Assessment  Dimension 1:  Acute Intoxication and/or Withdrawal Potential:   Dimension 1:  Description of individual's past and current experiences of substance use and withdrawal: Pt denies any substance dependence or tolerance. social etoh use  Dimension 2:  Biomedical Conditions and Complications:      Dimension 3:  Emotional, Behavioral, or Cognitive Conditions and Complications:     Dimension 4:  Readiness to Change:     Dimension 5:  Relapse, Continued use, or Continued Problem Potential:     Dimension 6:  Recovery/Living Environment:     ASAM Severity Score: ASAM's Severity Rating Score: 0  ASAM Recommended Level of Treatment:     Recommendations:  Per Gillermo Murdoch, FNP pt meets inpatient criteria  Nefertari Rebman R Jonie Burdell

## 2020-08-03 NOTE — ED Triage Notes (Signed)
Patient has IVc papers and was brought in by [facility] officers x 2. IVC papers say: and danger to himself and others, suicidal and speaks on anincreasing basis aboout going to places where he contemplates jumping off of a parking deck or wrecking his car while driving off of the highway Patient rants and rages violently and aggressively about how he has failed GOD because he got a Tdap vaccine and now he will be enslaved Unable to to function on a daily bases due to being overwelmed emotionally. Complains of physical pain he associates with the microchip now implanted in him.(vaccine)  Patient was cooperative in triage.

## 2020-08-03 NOTE — ED Provider Notes (Signed)
[facility]-EMERGENCY DEPT Provider Note   CSN: [id number] Arrival date & time: 08/03/20  1456     History Chief Complaint  Patient presents with  . IVC  . Suicidal    Keith Guerrero is a 24 y.o. male who presents to the ED under IVC placed by his mother for suicidal ideation.   Pt reports he does not want to harm himself. That he loves his life. He states "this all started on June 12th when I was bitten by a dog." He states he went to [facility] at that time and was treated with oral abx and a tetanus shot. He reports his last tdap was 11 years prior. He states that about 5 minutes after leaving the clinic he began feeling "paresthesias" in his left arm near the injection site that proceed to go up his neck on both sides and to his head. He states that since then he has felt a pressure sensation in his head. He reports he saw his PCP who confirmed his diagnosis of "chronic inflammatory syndrome" and he has been treating it with multiple medications OTC including zinc and n-acetylcesteine. Pt reports he saw his mom yesterday - states he fell to the floor and "convulsed" however he was alert the entire time. Afterwards he had some pain in his "left pectoral region" and reported to his mom "I think I'm going to die." He states that his mom left at that point and next thing he knew police were at his door today with IVC paperwork.   Per IVC paperwork: "He is a danger to himself and or others. Suicidal, speaks on a increasing basis about doing it and physically goes o placed where he contemplates jumping off a parking deck or wrecking his car driving on the highway, etc. Continuously rants/rages and violent and aggressively lashes out at how he has failed god because he got a tdap vaccine and now will be enslaved too. He is unable to function on a daily basis due to being overwhelmed emotionally. Complains of physical pain he associates with the microchip now implanted  in him (vaccine).   Additional information obtained by mother - States that pt had been overall doing well until he began hyper focusing on symptoms he was having after receiving the tetanus injection on June 12th. She states that pt has had a couple of ED visits/mandatory holds related to suicidal ideation in the past. She states he was in [place] visiting a friend in Feb 2018 and was placed on a 3 day hold - he was diagnosed with questionable bipolar disorder at that time and told to see a psychiatrist when he got home. Pt went to see multiple providers however only saw them for 1 visit voluntarily and then would not want to go back. He was never properly diagnosed and never medicated. Mom reports that since COVID began pt starting believing that he was a "prophet" chosen by God to help those individuals who were not vaccinated as the people getting vaccinated would all be enslaved by the end of this year. Pt also believes that since he received the tetanus vaccine that he has a "toxin" inside his body that he needs to get rid of - he believes all vaccines carry this now and that he is never getting another vaccine. He has been taking multiple OTC medications to get rid of this. Mom also reports that pt watched a video online the other day about filling a metal bucket  with water and wrapping it with medal to attach to an electrical socket - pt was planning to do this and put his feet in the water which would help get rid of this toxin. Mom began worrying at that point regarding pt's safety. She also mentions that over the past couple of weeks pt has mentions multiple plans of self harm including driving off of a bridge 2-3 weeks ago and jumping off of an 8 story parking garage however pt never went through with this as he did not want God to be upset with him and go to [place]. He began mentioning 2 days ago that if you do kill yourself you will only go to [facility] for a couple of weeks and then go to wherever you  desire including [place] so it wasn't that big of a deal to kill yourself which really worried mom. She was at pt's house yesterday and he was very tearful and seemed in distress which upset her. She has been trying to get him into a psychiatrist office however they are booked solid until end of October. They told her she had an option to involuntarily commit him if she was worried about his safety which mom did today.    The history is provided by the patient, a parent and medical records.       Past Medical History:  Diagnosis Date  . Pneumonia     Patient Active Problem List   Diagnosis Date Noted  . Adjustment disorder with anxious mood 06/30/2015  . Chest pain, midsternal 03/22/2015  . Anxiety attack 03/22/2015  . Anxiety 03/22/2015  . Right foot pain 05/18/2014  . Fever, unspecified 05/18/2014  . Viral syndrome 05/17/2014  . BMI (body mass index), pediatric, 5% to less than 85% for age 53/06/2013  . LEG LEGNTH DISCREPANCY 04/13/2007  . PES PLANUS, CONGENITAL 04/13/2007  . DEFORMITY, FOOT NEC, CONGENITAL 04/13/2007    Past Surgical History:  Procedure Laterality Date  . WISDOM TOOTH EXTRACTION         Family History  Problem Relation Age of Onset  . Healthy Mother   . Healthy Father   . Hypertension Other     Social History   Tobacco Use  . Smoking status: Never Smoker  . Smokeless tobacco: Never Used  Vaping Use  . Vaping Use: Never used  Substance Use Topics  . Alcohol use: No  . Drug use: No    Home Medications Prior to Admission medications   Medication Sig Start Date End Date Taking? Authorizing Provider  ACETYLCYSTEINE PO Take 1,200 mg by mouth daily.   Yes [provider]  Cholecalciferol (VITAMIN D) 50 MCG (2000 UT) tablet Take 2,000 Units by mouth daily.   Yes [provider]  Multiple Vitamins-Minerals (ZINC PO) Take 1 tablet by mouth daily.   Yes [provider]  omeprazole (PRILOSEC) 20 MG capsule Take 1 capsule  (20 mg total) by mouth daily. Patient not taking: Reported on 08/03/2020 03/29/17   Everlene Farrier, PA-C  ondansetron (ZOFRAN ODT) 4 MG disintegrating tablet Take 1 tablet (4 mg total) by mouth every 8 (eight) hours as needed for nausea or vomiting. Patient not taking: Reported on 08/03/2020 03/29/17   Everlene Farrier, PA-C    Allergies    Fish allergy and Other  Review of Systems   Review of Systems  Constitutional: Negative for chills and fever.  Respiratory: Negative for shortness of breath.   Cardiovascular: Negative for chest pain.  Gastrointestinal: Negative for  abdominal pain, nausea and vomiting.  Psychiatric/Behavioral: Positive for hallucinations. The patient is nervous/anxious.     Physical Exam Updated Vital Signs BP (!) 145/94 (BP Location: Right Arm)   Pulse 97   Temp 98.4 F (36.9 C) (Oral)   Resp 16   Ht 5\' 8"  (1.727 m)   Wt 65.8 kg   SpO2 98%   BMI 22.05 kg/m   Physical Exam Vitals and nursing note reviewed.  Constitutional:      Appearance: He is not ill-appearing or diaphoretic.  HENT:     Head: Normocephalic and atraumatic.  Eyes:     Conjunctiva/sclera: Conjunctivae normal.  Cardiovascular:     Rate and Rhythm: Normal rate and regular rhythm.     Pulses: Normal pulses.  Pulmonary:     Effort: Pulmonary effort is normal.     Breath sounds: Normal breath sounds. No wheezing, rhonchi or rales.  Abdominal:     Palpations: Abdomen is soft.     Tenderness: There is no abdominal tenderness.  Musculoskeletal:     Cervical back: Neck supple.  Skin:    General: Skin is warm and dry.  Neurological:     Mental Status: He is alert.  Psychiatric:        Mood and Affect: Mood is anxious.        Speech: Speech is rapid and pressured.     ED Results / Procedures / Treatments   Labs (all labs ordered are listed, but only abnormal results are displayed) Labs Reviewed  COMPREHENSIVE METABOLIC PANEL - Abnormal; Notable for the following components:       Result Value   Glucose, Bld 111 (*)    Total Protein 8.5 (*)    Albumin 5.2 (*)    All other components within normal limits  SALICYLATE LEVEL - Abnormal; Notable for the following components:   Salicylate Lvl <7.0 (*)    All other components within normal limits  ACETAMINOPHEN LEVEL - Abnormal; Notable for the following components:   Acetaminophen (Tylenol), Serum <10 (*)    All other components within normal limits  ETHANOL  CBC  RAPID URINE DRUG SCREEN, HOSP PERFORMED    EKG None  Radiology No results found.  Procedures Procedures (including critical care time)  Medications Ordered in ED Medications - No data to display  ED Course  I have reviewed the triage vital signs and the nursing notes.  Pertinent labs & imaging results that were available during my care of the patient were reviewed by me and considered in my medical decision making (see chart for details).    MDM Rules/Calculators/A&P                          Please see above.  Patient here under IVC with concern for suicidal ideation.  It appears patient is also having some hallucinations and thoughts of grandeur.  Patient is denying this.  He is very focused on physical complaints that he has been having since receiving a tetanus vaccine on June 12.  He appears to be very educated and is very knowledgeable.  He does not mention anything stated in the IVC paperwork however I had an extensive conversation with his mother for over 30 minutes regarding everything that has been going on for the past several years, currently has escalated since receiving the tetanus vaccine.  He believes that all vaccines have a toxin in them that is going to cause most of the population  to be in slipped and he is a Profitt here to help those are who are unvaccinated.  Nodes are stable today.  Patient's exam overall unremarkable except for pressured speech.  We will plan for medical clearance with lab work and TTS consult.   Labwork  unremarkable at this time. He is medically cleared. Pending TTS recommendations.   This note was prepared using Dragon voice recognition software and may include unintentional dictation errors due to the inherent limitations of voice recognition software.  Final Clinical Impression(s) / ED Diagnoses Final diagnoses:  None    Rx / DC Orders ED Discharge Orders    None       Tanda Rockers, PA-C 08/03/20 1801    Mancel Bale, MD 08/05/20 2024

## 2020-08-04 ENCOUNTER — Inpatient Hospital Stay (HOSPITAL_COMMUNITY)
Admission: AD | Admit: 2020-08-04 | Discharge: 2020-08-06 | DRG: 885 | Disposition: A | Discharge status: Home / Self Care | Payer: BC Managed Care – PPO | Source: Intra-hospital | Attending: Psychiatry | Admitting: Psychiatry

## 2020-08-04 ENCOUNTER — Encounter (HOSPITAL_COMMUNITY): Payer: Self-pay | Admitting: Psychiatry

## 2020-08-04 ENCOUNTER — Emergency Department (HOSPITAL_COMMUNITY): Payer: BC Managed Care – PPO

## 2020-08-04 ENCOUNTER — Other Ambulatory Visit: Payer: Self-pay

## 2020-08-04 DIAGNOSIS — Z560 Unemployment, unspecified: Secondary | ICD-10-CM | POA: Diagnosis not present

## 2020-08-04 DIAGNOSIS — Z8249 Family history of ischemic heart disease and other diseases of the circulatory system: Secondary | ICD-10-CM

## 2020-08-04 DIAGNOSIS — F419 Anxiety disorder, unspecified: Secondary | ICD-10-CM | POA: Diagnosis present

## 2020-08-04 DIAGNOSIS — F310 Bipolar disorder, current episode hypomanic: Secondary | ICD-10-CM | POA: Diagnosis not present

## 2020-08-04 DIAGNOSIS — F6381 Intermittent explosive disorder: Secondary | ICD-10-CM

## 2020-08-04 DIAGNOSIS — Z20822 Contact with and (suspected) exposure to covid-19: Secondary | ICD-10-CM | POA: Diagnosis present

## 2020-08-04 DIAGNOSIS — R569 Unspecified convulsions: Secondary | ICD-10-CM | POA: Diagnosis not present

## 2020-08-04 DIAGNOSIS — R45851 Suicidal ideations: Secondary | ICD-10-CM | POA: Diagnosis present

## 2020-08-04 DIAGNOSIS — I4510 Unspecified right bundle-branch block: Secondary | ICD-10-CM | POA: Diagnosis present

## 2020-08-04 DIAGNOSIS — F319 Bipolar disorder, unspecified: Principal | ICD-10-CM | POA: Diagnosis present

## 2020-08-04 DIAGNOSIS — Z91013 Allergy to seafood: Secondary | ICD-10-CM

## 2020-08-04 DIAGNOSIS — F22 Delusional disorders: Secondary | ICD-10-CM

## 2020-08-04 LAB — RESP PANEL BY RT PCR (RSV, FLU A&B, COVID)
Influenza A by PCR: NEGATIVE
Influenza B by PCR: NEGATIVE
Respiratory Syncytial Virus by PCR: NEGATIVE
SARS Coronavirus 2 by RT PCR: NEGATIVE

## 2020-08-04 MED ORDER — HYDROXYZINE HCL 25 MG PO TABS
25.0000 mg | ORAL_TABLET | Freq: Three times a day (TID) | ORAL | Status: DC | PRN
  Filled 2020-08-04: qty 1

## 2020-08-04 MED ORDER — ZIPRASIDONE MESYLATE 20 MG IM SOLR: 20.0000 mg | INTRAMUSCULAR | Status: DC | PRN

## 2020-08-04 MED ORDER — RISPERIDONE 2 MG PO TBDP: 2.0000 mg | ORAL_TABLET | Freq: Three times a day (TID) | ORAL | Status: DC | PRN

## 2020-08-04 MED ORDER — LORAZEPAM 1 MG PO TABS: 1.0000 mg | ORAL_TABLET | ORAL | Status: DC | PRN

## 2020-08-04 MED ORDER — TRAZODONE HCL 50 MG PO TABS
50.0000 mg | ORAL_TABLET | Freq: Every evening | ORAL | Status: DC | PRN
  Filled 2020-08-04 (×2): qty 1

## 2020-08-04 MED ORDER — ALUM & MAG HYDROXIDE-SIMETH 200-200-20 MG/5ML PO SUSP: 30.0000 mL | ORAL | Status: DC | PRN

## 2020-08-04 MED ORDER — ACETAMINOPHEN 325 MG PO TABS: 650.0000 mg | ORAL_TABLET | Freq: Four times a day (QID) | ORAL | Status: DC | PRN

## 2020-08-04 MED ORDER — MAGNESIUM HYDROXIDE 400 MG/5ML PO SUSP: 30.0000 mL | Freq: Every day | ORAL | Status: DC | PRN

## 2020-08-04 NOTE — BHH Group Notes (Signed)
.  Psychoeducational Group Note    Date: 08-04-2020 Time: 1300-1400    Life Skills: This group discusses  Purpose of Group: . The group focus' on teaching patients on how to identify their needs and how to develop the coping skills needed to get their needs met  Participation Level:  Did not attend   Paulino Rily

## 2020-08-04 NOTE — Tx Team (Signed)
Initial Treatment Plan 08/04/2020 4:56 PM [name] VOU:[id number]    PATIENT STRESSORS: Health problems Marital or family conflict Traumatic event   PATIENT STRENGTHS: Ability for insight Capable of independent living   PATIENT IDENTIFIED PROBLEMS: "health"  "pandemic"                   DISCHARGE CRITERIA:  Ability to meet basic life and health needs Adequate post-discharge living arrangements Improved stabilization in mood, thinking, and/or behavior  PRELIMINARY DISCHARGE PLAN: Attend aftercare/continuing care group Attend PHP/IOP  PATIENT/FAMILY INVOLVEMENT: This treatment plan has been presented to and reviewed with the patient, Keith Guerrero, and/or family member.  The patient and family have been given the opportunity to ask questions and make suggestions.  Layla Barter, RN 08/04/2020, 4:56 PM

## 2020-08-04 NOTE — ED Notes (Signed)
Call from Glendale Chard RN [facility] requesting H&P and IVC papers to be faxed for evaluation of pt for potential admission. Ph [phone]                                                                                Fax [phone]

## 2020-08-04 NOTE — Progress Notes (Signed)
The patient was involuntarily admitted to the adult unit [facility] hall. He presented with rapid speech and flight of ideas. He reported that three weeks ago he was having suicidal thoughts, with no plan or intent to commit suicide. He stated that his parents were worried about him and had him involuntarily committed. He reported previously telling his parents that he would throw himself off of a parking deck. He reported that he wasn't going to do it because he doesn't want to throw his life away. He reported that in June 2021 he got the TDAP vaccine because of a dog bite, and since then, he's been having burning and pricking sensations in his head. He reported that the sensations vary in severity. He reported seeing multiple doctors who weren't able to tell him what is causing the sensations. He stated that they think it's all in his head.   He denies active SI/HI. He denies AVH. He reports inconsistencies in his sleep pattern, ranging from 4-5 hours to 9 hours per night.   He reported a past psychiatric hospitalization in 2018, in [place] for suicidal ideations.

## 2020-08-04 NOTE — H&P (Signed)
Psychiatric Admission Assessment Adult  Patient Identification: Keith Guerrero MRN:  [id number] Date of Evaluation:  08/04/2020 Chief Complaint:  Bipolar disorder (HCC) [F31.9] Principal Diagnosis: Bipolar disorder (HCC) Diagnosis:  Principal Problem:   Bipolar disorder (HCC)  History of Present Illness: Patient is 24 year old male with past history of bipolar disorder presented to [facility], IVCed by his mom for suicidal ideation without any plan.  Patient states he was fine and healthy until April 28, 2020 when he was bitten by a dog and he went to [facility] where he got Tdap shot. Within 10 minutes after the shot he started getting paresthesias,  tingling sensation in arm, pressure in his head and at the injection site. He went to multiple doctors and he was diagnosed with "chronic inflammatory syndrome" but nobody could find out how to treat that. Patient is taking vitamin D and zinc for chronic inflammatory syndrome. He states he is very frustrated because of his disease and pain and he got an anger outburst recently with mom when he said "I was tired of living in pain, I wish something would happen to me" and after that his mom called police and IVCed him. He reports that he was admitted in [place] in Feb 2018 and they were thinking of bipolar disorder but they did not put him on any medication. At that time he was depressed because of bullying and had suicidal ideation. He also shared another episode when he felt suicidal in December 2019 because his girlfriend used him for sex and left him. He reports disturbed sleep, low energy, anhedonia, hopelessness, helplessness , a lot of guilt, decreased concentration, feeling powerless, low motivation and feeling of being burden on his family. Patient states he was feeling depressed from June 2021 to August 2021 because of his chronic inflammatory syndrome but doesn't feel depressed now. Patient states he sleeps for 5 to 6 hours at night but wakes  up multiple times. He states sometimes he has very high energy especially when he is in pain. He states he thinks fast and talks fast since childhood but recently after the injection he had been little slow in thinking and feels a disconnection. He states he feels  frustrated but try his best to be hopeful and believes in doing natural healing techniques such as medication, exercise, and eating healthy. Currently, patient denies any suicidal and homicidal ideation. Pt states he loves his life and doesn't want to die. He denies any visual and auditory hallucination. He does have some anxiety. He denies any paranoia.  He denies any history of verbal, physical and sexual abuse. He denies access to guns. He denies using any drugs. He drinks wine once a week at church. He states he prays a lot so that all of his sufferings go away. He states he believes in [place] Philosophy of love and healing and believes in miracles. He denies any problems with law enforcement. He is single and lives alone since June 2021. He was living with parents before June 2021. He thinks that his parents forced him to get Tdap shot and blames them for his sufferings. Patient is unemployed for one month, previously worked at [employer]. He studied History and English at [employer] and graduated in May 2021. Patient states he was planning on getting a Master's degree in intelligence services and was thinking of going to [place] but then his heath went downhill because of Tdap vaccination and chronic inflammatory syndrome. Patient states he is interested  in therapy only and will not take any medication. He states he has to give a chance to therapy first otherwise he will feel guilty. He is also afraid to start any medication because of his bad experience with Tdap.  On examination, Pt is pressured with circumstantial thought process. He is pleasant on approach. His speech is rapid and pressured with normal volume. His eye contact is good. His mood  is Euthymic and affect is congruent. He is delusional. No SI, HI and AVH.   Per EMR, Additional information obtained by mother in the ED- States that pt had been overall doing well until he began hyper focusing on symptoms he was having after receiving the tetanus injection on June 12th. She states that pt has had a couple of ED visits/mandatory holds related to suicidal ideation in the past. She states he was in [place] visiting a friend in Feb 2018 and was placed on a 3 day hold - he was diagnosed with questionable bipolar disorder at that time and told to see a psychiatrist when he got home. Pt went to see multiple providers however only saw them for 1 visit voluntarily and then would not want to go back. He was never properly diagnosed and never medicated. Mom reports that since COVID began pt starting believing that he was a "prophet" chosen by God to help those individuals who were not vaccinated as the people getting vaccinated would all be enslaved by the end of this year. Pt also believes that since he received the tetanus vaccine that he has a "toxin" inside his body that he needs to get rid of - he believes all vaccines carry this now and that he is never getting another vaccine. He has been taking multiple OTC medications to get rid of this. Mom also reports that pt watched a video online the other day about filling a metal bucket with water and wrapping it with medal to attach to an electrical socket - pt was planning to do this and put his feet in the water which would help get rid of this toxin. Mom began worrying at that point regarding pt's safety. She also mentions that over the past couple of weeks pt has mentions multiple plans of self harm including driving off of a bridge 2-3 weeks ago and jumping off of an 8 story parking garage however pt never went through with this as he did not want God to be upset with him and go to [place]. He began mentioning 2 days ago that if you do kill yourself you  will only go to [facility] for a couple of weeks and then go to wherever you desire including [place] so it wasn't that big of a deal to kill yourself which really worried mom. She was at pt's house yesterday and he was very tearful and seemed in distress which upset her. She has been trying to get him into a psychiatrist office however they are booked solid until end of October. They told her she had an option to involuntarily commit him if she was worried about his safety which mom did today.    Associated Signs/Symptoms: Depression Symptoms:  anhedonia, difficulty concentrating, hopelessness, impaired memory, suicidal thoughts without plan, anxiety, loss of energy/fatigue, disturbed sleep, Duration of Depression Symptoms: No data recorded (Hypo) Manic Symptoms:  Distractibility, Impulsivity, Irritable Mood, Anxiety Symptoms:  Excessive Worry, Psychotic Symptoms:  Delusions, Duration of Psychotic Symptoms: No data recorded PTSD Symptoms: Negative Total Time spent with patient: 1.5 hours  Past Psychiatric History: Inpatient Hospitalization in Feb 2018 with ? Diagnosis of Bipolar Disorder.   Is the patient at risk to self? No.  Has the patient been a risk to self in the past 6 months? Yes.    Has the patient been a risk to self within the distant past? Yes.    Is the patient a risk to others? No.  Has the patient been a risk to others in the past 6 months? No.  Has the patient been a risk to others within the distant past? No.   Prior Inpatient Therapy:   Prior Outpatient Therapy:    Alcohol Screening: Patient refused Alcohol Screening Tool: Yes 1. How often do you have a drink containing alcohol?: Never 2. How many drinks containing alcohol do you have on a typical day when you are drinking?: 1 or 2 3. How often do you have six or more drinks on one occasion?: Never AUDIT-C Score: 0 9. Have you or someone else been injured as a result of your drinking?: No 10. Has a relative or  friend or a doctor or another health worker been concerned about your drinking or suggested you cut down?: No Alcohol Use Disorder Identification Test Final Score (AUDIT): 0 Alcohol Brief Interventions/Follow-up: AUDIT Score <7 follow-up not indicated Substance Abuse History in the last 12 months:  No. Consequences of Substance Abuse: Negative Previous Psychotropic Medications: No  Psychological Evaluations: Yes  Past Medical History:  Past Medical History:  Diagnosis Date  . Pneumonia     Past Surgical History:  Procedure Laterality Date  . WISDOM TOOTH EXTRACTION     Family History:  Family History  Problem Relation Age of Onset  . Healthy Mother   . Healthy Father   . Hypertension Other    Family Psychiatric  History: Non contribitory Tobacco Screening: Have you used any form of tobacco in the last 30 days? (Cigarettes, Smokeless Tobacco, Cigars, and/or Pipes): No Social History:  Social History   Substance and Sexual Activity  Alcohol Use No     Social History   Substance and Sexual Activity  Drug Use No    Additional Social History:                           Allergies:   Allergies  Allergen Reactions  . Fish Allergy Hives  . Other Hives and Itching    "Sea Bass"   Lab Results:  Results for orders placed or performed during the hospital encounter of 08/03/20 (from the past 48 hour(s))  Comprehensive metabolic panel     Status: Abnormal   Collection Time: 08/03/20  3:31 PM  Result Value Ref Range   Sodium 138 135 - 145 mmol/L   Potassium 3.9 3.5 - 5.1 mmol/L   Chloride 101 98 - 111 mmol/L   CO2 26 22 - 32 mmol/L   Glucose, Bld 111 (H) 70 - 99 mg/dL    Comment: Glucose reference range applies only to samples taken after fasting for at least 8 hours.   BUN 18 6 - 20 mg/dL   Creatinine, Ser 1.61 0.61 - 1.24 mg/dL   Calcium 09.6 8.9 - 04.5 mg/dL   Total Protein 8.5 (H) 6.5 - 8.1 g/dL   Albumin 5.2 (H) 3.5 - 5.0 g/dL   AST 20 15 - 41 U/L   ALT  15 0 - 44 U/L   Alkaline Phosphatase 56 38 - 126 U/L   Total Bilirubin  0.7 0.3 - 1.2 mg/dL   GFR calc non Af Amer >60 >60 mL/min   GFR calc Af Amer >60 >60 mL/min   Anion gap 11 5 - 15    Comment: Performed at [facility], [address] [place], [place] [address]  Ethanol     Status: None   Collection Time: 08/03/20  3:31 PM  Result Value Ref Range   Alcohol, Ethyl (B) <10 <10 mg/dL    Comment: (NOTE) Lowest detectable limit for serum alcohol is 10 mg/dL.  For medical purposes only. Performed at [facility], [address] [place], [place] [address]   Salicylate level     Status: Abnormal   Collection Time: 08/03/20  3:31 PM  Result Value Ref Range   Salicylate Lvl <7.0 (L) 7.0 - 30.0 mg/dL    Comment: Performed at [facility], [address] [place], [place] [address]  Acetaminophen level     Status: Abnormal   Collection Time: 08/03/20  3:31 PM  Result Value Ref Range   Acetaminophen (Tylenol), Serum <10 (L) 10 - 30 ug/mL    Comment: (NOTE) Therapeutic concentrations vary significantly. A range of 10-30 ug/mL  may be an effective concentration for many patients. However, some  are best treated at concentrations outside of this range. Acetaminophen concentrations >150 ug/mL at 4 hours after ingestion  and >50 ug/mL at 12 hours after ingestion are often associated with  toxic reactions.  Performed at [facility], [address] [place], [place] [address]   cbc     Status: None   Collection Time: 08/03/20  3:31 PM  Result Value Ref Range   WBC 8.0 4.0 - 10.5 K/uL   RBC 5.17 4.22 - 5.81 MIL/uL   Hemoglobin 15.7 13.0 - 17.0 g/dL   HCT 25.345.4 39 - 52 %   MCV 87.8 80.0 - 100.0 fL   MCH 30.4 26.0 - 34.0 pg   MCHC 34.6 30.0 - 36.0 g/dL   RDW 66.411.9 40.311.5 - 47.415.5 %   Platelets 198 150 - 400 K/uL   nRBC 0.0 0.0 - 0.2 %    Comment: Performed at [facility], [address]  [place], [place] [address]  Rapid urine drug screen (hospital performed)     Status: None   Collection Time: 08/03/20  4:45 PM  Result Value Ref Range   Opiates NONE DETECTED NONE DETECTED   Cocaine NONE DETECTED NONE DETECTED   Benzodiazepines NONE DETECTED NONE DETECTED   Amphetamines NONE DETECTED NONE DETECTED   Tetrahydrocannabinol NONE DETECTED NONE DETECTED   Barbiturates NONE DETECTED NONE DETECTED    Comment: (NOTE) DRUG SCREEN FOR MEDICAL PURPOSES ONLY.  IF CONFIRMATION IS NEEDED FOR ANY PURPOSE, NOTIFY LAB WITHIN 5 DAYS.  LOWEST DETECTABLE LIMITS FOR URINE DRUG SCREEN Drug Class                     Cutoff (ng/mL) Amphetamine and metabolites    1000 Barbiturate and metabolites    200 Benzodiazepine                 200 Tricyclics and metabolites     300 Opiates and metabolites        300 Cocaine and metabolites        300 THC  50 Performed at [facility], [address], [place], [place] [address]   Resp Panel by RT PCR (RSV, Flu A&B, Covid) - Nasopharyngeal Swab     Status: None   Collection Time: 08/04/20  3:20 AM   Specimen: Nasopharyngeal Swab  Result Value Ref Range   SARS Coronavirus 2 by RT PCR NEGATIVE NEGATIVE    Comment: (NOTE) SARS-CoV-2 target nucleic acids are NOT DETECTED.  The SARS-CoV-2 RNA is generally detectable in upper respiratoy specimens during the acute phase of infection. The lowest concentration of SARS-CoV-2 viral copies this assay can detect is 131 copies/mL. A negative result does not preclude SARS-Cov-2 infection and should not be used as the sole basis for treatment or other patient management decisions. A negative result may occur with  improper specimen collection/handling, submission of specimen other than nasopharyngeal swab, presence of viral mutation(s) within the areas targeted by this assay, and inadequate number of viral copies (<131 copies/mL). A negative result must be combined with  clinical observations, patient history, and epidemiological information. The expected result is Negative.  Fact Sheet for Patients:  https://www.moore.com/  Fact Sheet for Healthcare Providers:  https://www.young.biz/  This test is no t yet approved or cleared by the [place] FDA and  has been authorized for detection and/or diagnosis of SARS-CoV-2 by FDA under an Emergency Use Authorization (EUA). This EUA will remain  in effect (meaning this test can be used) for the duration of the COVID-19 declaration under Section 564(b)(1) of the Act, 21 U.S.C. section 360bbb-3(b)(1), unless the authorization is terminated or revoked sooner.     Influenza A by PCR NEGATIVE NEGATIVE   Influenza B by PCR NEGATIVE NEGATIVE    Comment: (NOTE) The Xpert Xpress SARS-CoV-2/FLU/RSV assay is intended as an aid in  the diagnosis of influenza from Nasopharyngeal swab specimens and  should not be used as a sole basis for treatment. Nasal washings and  aspirates are unacceptable for Xpert Xpress SARS-CoV-2/FLU/RSV  testing.  Fact Sheet for Patients: https://www.moore.com/  Fact Sheet for Healthcare Providers: https://www.young.biz/  This test is not yet approved or cleared by the [place] FDA and  has been authorized for detection and/or diagnosis of SARS-CoV-2 by  FDA under an Emergency Use Authorization (EUA). This EUA will remain  in effect (meaning this test can be used) for the duration of the  Covid-19 declaration under Section 564(b)(1) of the Act, 21  U.S.C. section 360bbb-3(b)(1), unless the authorization is  terminated or revoked.    Respiratory Syncytial Virus by PCR NEGATIVE NEGATIVE    Comment: (NOTE) Fact Sheet for Patients: https://www.moore.com/  Fact Sheet for Healthcare Providers: https://www.young.biz/  This test is not yet approved or cleared by the  [place] FDA and  has been authorized for detection and/or diagnosis of SARS-CoV-2 by  FDA under an Emergency Use Authorization (EUA). This EUA will remain  in effect (meaning this test can be used) for the duration of the  COVID-19 declaration under Section 564(b)(1) of the Act, 21 U.S.C.  section 360bbb-3(b)(1), unless the authorization is terminated or  revoked. Performed at [facility], [address], [place], [place] [address]     Blood Alcohol level:  Lab Results  Component Value Date   ETH <10 08/03/2020    Metabolic Disorder Labs:  No results found for: HGBA1C, MPG No results found for: PROLACTIN No results found for: CHOL, TRIG, HDL, CHOLHDL, VLDL, LDLCALC  Current Medications: Current Facility-Administered Medications  Medication Dose Route Frequency  Provider Last Rate Last Admin  . acetaminophen (TYLENOL) tablet 650 mg  650 mg Oral Q6H PRN Antonieta Pert, MD      . alum & mag hydroxide-simeth (MAALOX/MYLANTA) 200-200-20 MG/5ML suspension 30 mL  30 mL Oral Q4H PRN Antonieta Pert, MD      . hydrOXYzine (ATARAX/VISTARIL) tablet 25 mg  25 mg Oral TID PRN Antonieta Pert, MD      . risperiDONE (RISPERDAL M-TABS) disintegrating tablet 2 mg  2 mg Oral Q8H PRN Antonieta Pert, MD       And  . LORazepam (ATIVAN) tablet 1 mg  1 mg Oral PRN Antonieta Pert, MD       And  . ziprasidone (GEODON) injection 20 mg  20 mg Intramuscular PRN Antonieta Pert, MD      . magnesium hydroxide (MILK OF MAGNESIA) suspension 30 mL  30 mL Oral Daily PRN Antonieta Pert, MD      . traZODone (DESYREL) tablet 50 mg  50 mg Oral QHS PRN Antonieta Pert, MD       PTA Medications: Medications Prior to Admission  Medication Sig Dispense Refill Last Dose  . ACETYLCYSTEINE PO Take 1,200 mg by mouth daily.     . Cholecalciferol (VITAMIN D) 50 MCG (2000 UT) tablet Take 2,000 Units by mouth daily.     . Multiple Vitamins-Minerals (ZINC PO) Take 1 tablet  by mouth daily.     Marland Kitchen omeprazole (PRILOSEC) 20 MG capsule Take 1 capsule (20 mg total) by mouth daily. (Patient not taking: Reported on 08/03/2020) 30 capsule 0   . ondansetron (ZOFRAN ODT) 4 MG disintegrating tablet Take 1 tablet (4 mg total) by mouth every 8 (eight) hours as needed for nausea or vomiting. (Patient not taking: Reported on 08/03/2020) 10 tablet 0     Musculoskeletal: Strength & Muscle Tone: within normal limits Gait & Station: normal Patient leans: N/A  Psychiatric Specialty Exam: Physical Exam Vitals and nursing note reviewed.  Constitutional:      General: He is not in acute distress.    Appearance: Normal appearance. He is not ill-appearing or toxic-appearing.  HENT:     Head: Normocephalic and atraumatic.  Cardiovascular:     Pulses: Normal pulses.  Neurological:     General: No focal deficit present.     Mental Status: He is alert and oriented to person, place, and time.     Review of Systems  Constitutional: Negative for activity change, appetite change, fatigue and fever.  Respiratory: Negative for chest tightness and shortness of breath.   Cardiovascular: Negative for chest pain and palpitations.  Gastrointestinal: Negative for abdominal pain, constipation, diarrhea and nausea.  Neurological: Negative for dizziness, light-headedness and headaches.  Psychiatric/Behavioral: Negative for dysphoric mood, hallucinations and suicidal ideas. The patient is nervous/anxious.     Blood pressure 126/79, pulse 76, temperature 99.5 F (37.5 C), temperature source Oral, resp. rate 18, height 5\' 8"  (1.727 m), weight 65.8 kg, SpO2 100 %.Body mass index is 22.05 kg/m.  General Appearance: Casual  Eye Contact:  Good  Speech:  Pressured  Volume:  Normal  Mood:  Euthymic  Affect:  Congruent  Thought Process:  Descriptions of Associations: Circumstantial  Orientation:  Full (Time, Place, and Person)  Thought Content:  Delusions  Suicidal Thoughts:  No  Homicidal  Thoughts:  No  Memory:  Immediate;   Good Recent;   Good Remote;   Good  Judgement:  Intact  Insight:  Lacking  Psychomotor Activity:  Normal  Concentration:  Concentration: Good  Recall:  Fair  Fund of Knowledge:  Good  Language:  Good  Akathisia:  Negative  Handed:  Right  AIMS (if indicated):     Assets:  Communication Skills Desire for Improvement Physical Health Resilience Talents/Skills Vocational/Educational  ADL's:  Intact  Cognition:  WNL  Sleep:       Treatment Plan Summary: Pt is admitted with above mentioned psychiatry history.  Patient is 24 year old male with past history of bipolar disorder presented to ED, IVCed by his mom for suicidal ideation without any plan.  On examination, Pt is pressured with circumstantial thought process. He is pleasant on approach. His speech is rapid and pressured with normal volume. His eye contact is good. His mood is Euthymic and affect is congruent. He is delusional. No SI, HI and AVH.  Diagnosis- Bipolar disoder  Labs- Electrolytes - WNL LFT -WNL Glucose-111 CBC- WNL U tox- Negative, Alcohol level <10, Salicylate level <7 Influenza A, B , Resp Sync Virus and COVID virus- Negative EKG - Normal sinus rhythm Incomplete right bundle branch block Borderline ECG, QTc- 384/442- Pt had Chest pain in ED and was cleared by medical doctor in ED Plan-  Daily contact with patient to assess and evaluate symptoms and progress in treatment  -Pt states he doesn't want to take any psychiatric medications.  - Monitor Vitals. -Monitor for Suicidal Ideation. -Monitor for withdrawal symptoms. -Monitor for medication side effects. -Agitation Protocol in place -Continue Malox /Mylanta  -Continue Milk of Mag 30 ml PRN for constipation -Continue Hydroxyzine 25 mg TID PRN for Anxiety. -Continue Trazodone 50 mg QHS PRN for sleep.  Observation Level/Precautions:  15 minute checks  Laboratory:  HbAIC Lipid Profile, TSH  Psychotherapy:     Medications:    Consultations:    Discharge Concerns:    Estimated LOS:  Other:     Physician Treatment Plan for Primary Diagnosis: Bipolar disorder (HCC) Long Term Goal(s): Improvement in symptoms so as ready for discharge  Short Term Goals: Ability to identify changes in lifestyle to reduce recurrence of condition will improve, Ability to verbalize feelings will improve, Ability to disclose and discuss suicidal ideas, Ability to demonstrate self-control will improve, Ability to identify and develop effective coping behaviors will improve, Ability to maintain clinical measurements within normal limits will improve, Compliance with prescribed medications will improve and Ability to identify triggers associated with substance abuse/mental health issues will improve  Physician Treatment Plan for Secondary Diagnosis: Principal Problem:   Bipolar disorder (HCC)  Long Term Goal(s): Improvement in symptoms so as ready for discharge  Short Term Goals: Ability to identify changes in lifestyle to reduce recurrence of condition will improve, Ability to verbalize feelings will improve, Ability to disclose and discuss suicidal ideas, Ability to demonstrate self-control will improve, Ability to identify and develop effective coping behaviors will improve, Ability to maintain clinical measurements within normal limits will improve, Compliance with prescribed medications will improve and Ability to identify triggers associated with substance abuse/mental health issues will improve  I certify that inpatient services furnished can reasonably be expected to improve the patient's condition.    Karsten Ro, MD 9/18/202110:37 PM

## 2020-08-04 NOTE — ED Notes (Signed)
Several attempts to cross EKG over to Epic unsuccessful

## 2020-08-04 NOTE — ED Notes (Signed)
John Charity fundraiser at [employer] notified of negative COVID test. States will notify his physican.

## 2020-08-04 NOTE — Progress Notes (Addendum)
Sheriff not responding at this time call again later.

## 2020-08-04 NOTE — ED Provider Notes (Signed)
ED ECG REPORT   Date: 08/04/2020  Rate: 72  Rhythm: normal sinus rhythm  QRS Axis: normal  Intervals: normal  ST/T Wave abnormalities: nonspecific T wave changes  Conduction Disutrbances:nonspecific intraventricular conduction delay  Narrative Interpretation:   Old EKG Reviewed: none available  I have personally reviewed the EKG tracing and agree with the computerized printout as noted.   Patient went for CT head to be cleared for psychiatry and mentioned that he had chest pain that has since resolved no previous EKGs are found.  Patient otherwise medically stable   Zadie Rhine, MD 08/04/20 [phone]

## 2020-08-04 NOTE — Progress Notes (Signed)
Pt accepted to Room 500-02 at [facility] to the service of MD Clary.  Report may be called to [phone] when transportation is arranged.

## 2020-08-05 LAB — HEMOGLOBIN A1C
Hgb A1c MFr Bld: 5 % (ref 4.8–5.6)
Mean Plasma Glucose: 96.8 mg/dL

## 2020-08-05 LAB — LIPID PANEL
Cholesterol: 176 mg/dL (ref 0–200)
HDL: 59 mg/dL (ref >40)
LDL Cholesterol: 105 mg/dL — ABNORMAL HIGH (ref 0–99)
Total CHOL/HDL Ratio: 3 RATIO
Triglycerides: 58 mg/dL (ref <150)
VLDL: 12 mg/dL (ref 0–40)

## 2020-08-05 LAB — TSH: TSH: 1.506 u[IU]/mL (ref 0.350–4.500)

## 2020-08-05 MED ORDER — OXCARBAZEPINE 150 MG PO TABS
75.0000 mg | ORAL_TABLET | Freq: Two times a day (BID) | ORAL | Status: DC
  Filled 2020-08-05 (×6): qty 0.5

## 2020-08-05 NOTE — BHH Group Notes (Signed)
Psychoeducational Group Note  Date:  08-05-20 Time:  1300  Group Topic/Focus:  Making Healthy Choices:   The focus of this group is to help patients identify negative/unhealthy choices they were using prior to admission and identify positive/healthier coping strategies to replace them upon discharge.  Participation Level:  Active  Participation Quality:  Appropriate  Affect:  Appropriate  Cognitive:  Oriented  Insight:  Improving  Engagement in Group:  Engaged  Additional Comments:  Pt was able to list 20 positives about himself and was able to share it with the group.  Dione Housekeeper

## 2020-08-05 NOTE — Progress Notes (Signed)
Pt has note rested well due to roommate constantly coming out of the room and being loud. Pt denied SI/HI and contracted for safety, calm and cooperative with care, will continue to monitor.

## 2020-08-05 NOTE — BHH Group Notes (Signed)
LCSW Group Therapy 08/05/20   Type of Therapy and Topic:  Group Therapy:  Setting Goals   Participation Level:  Active   Description of Group: In this process group, patients discussed using strengths to work toward goals and address challenges.  Patients identified two positive things about themselves and one goal they were working on.  Patients were given the opportunity to share openly and support each other's plan for self-empowerment.  The group discussed the value of gratitude and were encouraged to have a daily reflection of positive characteristics or circumstances.  Patients were encouraged to identify a plan to utilize their strengths to work on current challenges and goals.   Therapeutic Goals 1. Patient will verbalize personal strengths/positive qualities and relate how these can assist with achieving desired personal goals 2. Patients will verbalize affirmation of peers plans for personal change and goal setting 3. Patients will explore the value of gratitude and positive focus as related to successful achievement of goals 4. Patients will verbalize a plan for regular reinforcement of personal positive qualities and circumstances.   Summary of Patient Progress: Patient identified the definition of goals. Patients was given the opportunity to share openly and support other group members' plan for self-empowerment. Patient verbalized personal strength and how they relate to achieving the desired goal. Patient was able to identify positive goals to work towards when he returns home. This includes working on being more hopeful.       Therapeutic Modalities Cognitive Behavioral Therapy Motivational Interviewing

## 2020-08-05 NOTE — Progress Notes (Signed)
Pt accepted to [facility] Room 302-02 to the service of MD Clary.  Report may be called to [phone] when transportation has been arranged.

## 2020-08-05 NOTE — Progress Notes (Signed)
   08/05/20 2313  COVID-19 Daily Checkoff  Have you had a fever (temp > 37.80C/100F)  in the past 24 hours?  No  If you have had runny nose, nasal congestion, sneezing in the past 24 hours, has it worsened? No  COVID-19 EXPOSURE  Have you traveled outside the state in the past 14 days? No  Have you been in contact with someone with a confirmed diagnosis of COVID-19 or PUI in the past 14 days without wearing appropriate PPE? No  Have you been living in the same home as a person with confirmed diagnosis of COVID-19 or a PUI (household contact)? No  Have you been diagnosed with COVID-19? No

## 2020-08-05 NOTE — Progress Notes (Signed)
   08/05/20 2323  Psych Admission Type (Psych Patients Only)  Admission Status Involuntary  Psychosocial Assessment  Patient Complaints None  Eye Contact Fair  Facial Expression Anxious  Affect Appropriate to circumstance  Speech Logical/coherent  Interaction Assertive  Motor Activity Fidgety  Appearance/Hygiene Unremarkable  Behavior Characteristics Appropriate to situation  Mood Anxious  Thought Process  Coherency Flight of ideas  Content Preoccupation  Delusions None reported or observed  Perception WDL  Hallucination None reported or observed  Judgment Impaired  Confusion None  Danger to Self  Current suicidal ideation? Denies  Danger to Others  Danger to Others None reported or observed

## 2020-08-05 NOTE — BHH Counselor (Signed)
Adult Comprehensive Assessment  Patient ID: Keith Guerrero, male   DOB: 08-23-1996, 24 y.o.   MRN: [id number]  Information Source: Information source: Patient  Current Stressors:  Patient states their primary concerns and needs for treatment are:: IVC'd by mother. Received Keith Tetatnus shot following Keith dog bite. Stated he did not feel comfortable with getting this shot, however felt pressured by his father. States after getting this injection he began to have painful flare ups that spread throughout his body, but specifically in his arm and head. Stated living with the pain at times is unbearable and causes him to think that he would rather die. Patient states their goals for this hospitilization and ongoing recovery are:: "To get through Keith day without pain flaring up" Educational / Learning stressors: Graduated from [employer] in May Employment / Job issues: Stated he is currently looking ofr employment in his field. Recently quit Keith job due to his anxiety and stress level increasing Family Relationships: Yes, with father. States his father is distant at times and they often get into Economist / Lack of resources (include bankruptcy): No, receives financial support from parents Housing / Lack of housing: No, currently living in Keith house owned by his father Physical health (include injuries & life threatening diseases): States this is his main souce of stress. Feeling unsure/unaware of what can be done for his pain and possible future health issues his Tetanus shot could lead to. Social relationships: States his friends recently moved away and feels lonely Substance abuse: Denies substance use Bereavement / Loss: States he is grieving the loss of excellent health  Living/Environment/Situation:  Living Arrangements: Alone Living conditions (as described by patient or guardian): States he is loneley, but likes his neighbors Who else lives in the home?: Self How long has patient lived in current  situation?: 3 months What is atmosphere in current home: Comfortable  Family History:  Marital status: Single Are you sexually active?: No What is your sexual orientation?: heterosexual Has your sexual activity been affected by drugs, alcohol, medication, or emotional stress?: Denies Does patient have children?: No  Childhood History:  By whom was/is the patient raised?: Both parents Additional childhood history information: Stated his childhood was "normal" until the age of about 14-9 and stated at this age his [place] and his father began carrying Keith gun around, had Keith safe room in their house, has bullet proof glass put in, etc.. Patient stated he was bullied during high school and felt that teachers/principal were aware of this and let this occur. Description of patient's relationship with caregiver when they were Keith child: Stated he was close with his father and mother. Stated his mother understood his love language and his father did not. Patient's description of current relationship with people who raised him/her: States he still has Keith good relationship with his mother and stated his father is distant and that they have different interests How were you disciplined when you got in trouble as Keith child/adolescent?: "Never disciplined" Does patient have siblings?: Yes Number of Siblings: 2 Description of patient's current relationship with siblings: Has Keith younger brother and sister and states their relationship is distant Did patient suffer any verbal/emotional/physical/sexual abuse as Keith child?: No Did patient suffer from severe childhood neglect?: No Has patient ever been sexually abused/assaulted/raped as an adolescent or adult?: No Was the patient ever Keith victim of Keith crime or Keith disaster?: No Witnessed domestic violence?: No Has patient been affected by  domestic violence as an adult?: No  Education:  Highest grade of school patient has completed: Graduated from [employer] Currently Keith student?: No Learning disability?: No  Employment/Work Situation:   Employment situation: Unemployed Patient's job has been impacted by current illness: Yes Describe how patient's job has been impacted: Increased stress and anxiety What is the longest time patient has Keith held Keith job?: 2 months 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 States he drinks Keith glass of wine on Keith weekly basis for religious purposes If attempted suicide, did drugs/alcohol play Keith role in this?: No Alcohol/Substance Abuse Treatment Hx: Denies past history Has alcohol/substance abuse ever caused legal problems?: No  Social Support System:   Patient's Community Support System: Good Describe Community Support System: Mother, Grandparents, neighbor, friends Type of faith/religion: States his father is jewish and his mother was raised [place]. He states he pulls from his parents religions, but is also spiritual and recently joined the [employer]. How does patient's faith help to cope with current illness?: "Single most important thing in life is truth and love"  Leisure/Recreation:   Do You Have Hobbies?: Yes Leisure and Hobbies: Archery  Strengths/Needs:   What is the patient's perception of their strengths?: "Faith" Patient states they can use these personal strengths during their treatment to contribute to their recovery: UTA Patient states these barriers may affect/interfere with their treatment: Patient declining medication Patient states these barriers may affect their return to the community: None Other important information patient would like considered  in planning for their treatment: Is not interested in CBT/DBT therapy modalities and prefers Jungian style therapist  Discharge Plan:   Currently receiving community mental health services: No Patient states concerns and preferences for aftercare planning are: is interested in therapy, does not want to see psychiatrist for mediction management Patient states they will know when they are safe and ready for discharge when: UTA Does patient have access to transportation?: Yes Does patient have financial barriers related to discharge medications?: No Patient description of barriers related to discharge medications: n/Keith Will patient be returning to same living situation after discharge?: Yes  Summary/Recommendations:   Summary and Recommendations (to be completed by the evaluator): Patient is 24 year old male with past history of bipolar disorder presented to [facility], IVCed by his mom for suicidal ideation without any plan. Patient states he was fine and healthy until April 28, 2020 when he was bitten by Keith dog and he went to [facility] where he got Tdap shot. Within 10 minutes after the shot he started getting paresthesias,  tingling sensation in arm, pressure in his head and at the injection site. He went to multiple doctors and he was diagnosed with "chronic inflammatory syndrome" but nobody could find out how to treat that. Patient is taking vitamin D and zinc for chronic inflammatory syndrome. He states he is very frustrated because of his disease and pain and he got an anger outburst recently with mom when he said "I was tired of living in pain, I wish something would happen to me" and after that his mom called police and IVCed him. He reports that he was admitted  in [place] in Feb 2018 and they were thinking of bipolar disorder but they did not put him on any medication.  While here, Keith Guerrero can benefit from crisis stabilization, medication management, therapeutic milieu, and referrals for  services.  Keith Guerrero Keith Guerrero. 08/05/2020

## 2020-08-05 NOTE — Progress Notes (Signed)
   08/05/20 0622  Vital Signs  Pulse Rate 89  BP 129/83  BP Location Right Arm  BP Method Automatic  Patient Position (if appropriate) Standing  Sleep  Number of Hours 5   D: Patient denies SI/HI/AVH. Patient denies anxiety and depression. Patient was out in open areas and was social with staff. Patient had some pressured speech, and jumped from topic to topic. Patient was moved over to 300 unit after lunch. A:  Patient refused all medicine on religious grounds.  Support and encouragement provided Routine safety checks conducted every 15 minutes. Patient  Informed to notify staff with any concerns.  Safety maintained.

## 2020-08-05 NOTE — BHH Group Notes (Signed)
Adult Psychoeducational Group Not Date:  08/05/2020 Time:  1000-1045 Group Topic/Focus: PROGRESSIVE RELAXATION. A group where deep breathing is taught and tensing and relaxation muscle groups is used. Imagery is used as well.  Pts are asked to imagine 3 pillars that hold them up when they are not able to hold themselves up.  Participation Level:  Active  Participation Quality:  Appropriate  Affect:  Appropriate  Cognitive:  Oriented  Insight: Improving  Engagement in Group:  Engaged  Modes of Intervention:  Activity, Discussion, Education, and Support  Additional Comments:  Pt came to the group late because he was talking to the case manager. Was intent on sharing his 3 pillars which are God, his faith and his moral principles. What he has learned is not to Keith Guerrero others by their mistakes.  Keith Guerrero 08/05/2020

## 2020-08-05 NOTE — BHH Suicide Risk Assessment (Signed)
[facility] Admission Suicide Risk Assessment   Nursing information obtained from:  Patient Demographic factors:  Male, Caucasian, Living alone, Unemployed Current Mental Status:  Suicidal ideation indicated by patient, Suicidal ideation indicated by others Loss Factors:  NA Historical Factors:  Prior suicide attempts, Impulsivity Risk Reduction Factors:  Sense of responsibility to family, Religious beliefs about death  Total Time spent with patient: 20 minutes Principal Problem: Bipolar disorder (HCC) Diagnosis:  Principal Problem:   Bipolar disorder (HCC)  Subjective Data: Patient is seen and examined.  Patient is a 24 year old male with a probable past psychiatric history significant for bipolar disorder who presented to the [facility] emergency department on 9/17 under involuntary commitment.  He was brought in by [facility] police.  The involuntary commitment papers stated that the patient was a danger to himself and others, had been stating he was suicidal, and was speaking on jumping off a bridge.  In the emergency department he was evaluated by the comprehensive clinical assessment note.  At that time he stated he had had suicidal ideation and treatment in the past including inpatient treatment.  This was in 2018.  He currently denies suicidal ideation, homicidal ideation, auditory or visual hallucinations.  He is manic and does have delusional thinking.  He has refused medications to this point and discusses the fact that he would rather use "natural methods" because of his religious beliefs.  He was placed under involuntary commitment and transferred to our facility.  Review of the electronic medical record revealed an emergency room note at [facility] on 10/25/2018.  The patient presented to the emergency department with suicidal ideation.  He communicated a desire to end his life and a text message to his ex-girlfriend.  He stated that at that time he had grabbed a knife and  considered stabbing himself, but put it down and had no intention of harming himself or committing suicide.  He also reported at that time seeing a counselor for the previous 7 years.  He has not received counseling in over a year in 2019.  He had a previous suicide attempt in February 2018 after a similar incident where he was hospitalized for 5 days at the [facility] [place].  He had been prescribed Lexapro, but was not compliant with that.  He was discharged to home.  He was transferred to our facility for continued evaluation and stabilization.  Continued Clinical Symptoms:  Alcohol Use Disorder Identification Test Final Score (AUDIT): 0 The "Alcohol Use Disorders Identification Test", Guidelines for Use in Primary Care, Second Edition.  World Science writer [facility]). Score between 0-7:  no or low risk or alcohol related problems. Score between 8-15:  moderate risk of alcohol related problems. Score between 16-19:  high risk of alcohol related problems. Score 20 or above:  warrants further diagnostic evaluation for alcohol dependence and treatment.   CLINICAL FACTORS:   Bipolar Disorder:   Mixed State   Musculoskeletal: Strength & Muscle Tone: within normal limits Gait & Station: normal Patient leans: N/A  Psychiatric Specialty Exam: Physical Exam Vitals and nursing note reviewed.  Constitutional:      Appearance: Normal appearance.  HENT:     Head: Normocephalic and atraumatic.  Pulmonary:     Effort: Pulmonary effort is normal.  Neurological:     General: No focal deficit present.     Mental Status: He is alert and oriented to person, place, and time.     Review of Systems  Blood pressure 129/83, pulse  89, temperature 98 F (36.7 C), temperature source Oral, resp. rate 18, height 5\' 8"  (1.727 m), weight 65.8 kg, SpO2 100 %.Body mass index is 22.05 kg/m.  General Appearance: Casual  Eye Contact:  Good  Speech:  Pressured  Volume:  Increased  Mood:  Anxious and  Dysphoric  Affect:  Congruent  Thought Process:  Goal Directed and Descriptions of Associations: Tangential  Orientation:  Full (Time, Place, and Person)  Thought Content:  Delusions  Suicidal Thoughts:  No  Homicidal Thoughts:  No  Memory:  Immediate;   Fair Recent;   Fair Remote;   Fair  Judgement:  Impaired  Insight:  Lacking  Psychomotor Activity:  Increased  Concentration:  Concentration: Fair and Attention Span: Fair  Recall:  of Knowledge:  Good  Language:  Good  Akathisia:  Negative  Handed:  Right  AIMS (if indicated):     Assets:  Desire for Improvement Resilience  ADL's:  Intact  Cognition:  WNL  Sleep:  Number of Hours: 5      COGNITIVE FEATURES THAT CONTRIBUTE TO RISK:  Thought constriction (tunnel vision)    SUICIDE RISK:   Minimal: No identifiable suicidal ideation.  Patients presenting with no risk factors but with morbid ruminations; may be classified as minimal risk based on the severity of the depressive symptoms  PLAN OF CARE: Patient is seen and examined.  Patient is a 24 year old male with a probable past psychiatric history significant for bipolar disorder who was transferred to our facility for continued evaluation and stabilization.  He is manic.  He denies auditory, visual or tactile hallucinations.  He denied suicidal or homicidal ideation.  He stated he does not desire any medications because of his religious believes.  We will monitor him for 24 hours and get collateral information.  We will have the agitation protocol with Risperdal in place in case it is required, but does not fulfill criteria for any forced medications at this time.  Review of his admission laboratories showed essentially normal electrolytes except for an albumin that was at 5.2 and a total protein of 8.5.  Lipids were normal.  His CBC was normal.  Acetaminophen was less than 10, salicylate less than 7.  His TSH was normal at 1.506.  Blood alcohol was less than 10, drug  screen was negative.  He had a CT scan of the head that was done which was completely negative.  EKG showed a sinus rhythm with an incomplete bundle branch block, but normal QTc interval.  I certify that inpatient services furnished can reasonably be expected to improve the patient's condition.   30, MD 08/05/2020, 11:26 AM

## 2020-08-05 NOTE — Progress Notes (Signed)
   Per Carollee Sires (representative of [employer] [place] at [phone])  Case Number for this patient: [id number] Approved dates of service:  08/04/20 - 08/10/20  (Fax machine not currently working and she is unable to fax at time of this entry)

## 2020-08-05 NOTE — Progress Notes (Signed)
BHH Group Notes:  (Nursing/MHT/Case Management/Adjunct)  Date:  08/05/2020  Time:  2015  Type of Therapy:wrap up group  Participation Level:  Active  Participation Quality:  Appropriate, Attentive, Sharing and Supportive  Affect:  Appropriate  Cognitive:  Alert  Insight:  Improving  Engagement in Group:  Engaged  Modes of Intervention:  Clarification, Education and Support  Summary of Progress/Problems: Positive thinking and positive change discussed.   Marcille Buffy 08/05/2020, 9:35 PM

## 2020-08-05 NOTE — Progress Notes (Signed)
Adult Psychoeducational Group Note  Date:  08/05/2020 Time:  2:06 AM  Group Topic/Focus:  Wrap-Up Group:   The focus of this group is to help patients review their daily goal of treatment and discuss progress on daily workbooks.  Participation Level:  Minimal  Participation Quality:  Appropriate  Affect:  Appropriate  Cognitive:  Disorganized and Confused  Insight: Limited  Engagement in Group:  Limited  Modes of Intervention:  Discussion  Additional Comments:  Pt stated his goal for today was to focus on his treatment plan. Pt stated he felt he accomplished his goal today. Pt stated he talk with his doctor and social worker, regarding his care today. Pt rated his overall day a 7 out of 10. Pt stated been able to contact his mother today, improved his overall day. Pt stated his appetite was pretty good today and he attended all meals. Pt stated his sleep last night was poor. Pt stated the goal for tonight was to get some rest. Pt nurse was made aware of situation. Pt stated he was in no physical pain. Pt deny auditory or visual hallucinations. Pt denies thoughts of harming himself or others. Pt stated he would alert staff if anything changes.  Keith Guerrero 08/05/2020, 2:06 AM

## 2020-08-06 MED ORDER — OXCARBAZEPINE 150 MG PO TABS: 75.0000 mg | ORAL_TABLET | Freq: Two times a day (BID) | ORAL | 0 refills | Status: DC

## 2020-08-06 NOTE — BHH Suicide Risk Assessment (Signed)
[facility] Discharge Suicide Risk Assessment   Principal Problem: Bipolar disorder [facility]) Discharge Diagnoses: Principal Problem:   Bipolar disorder (HCC)   Total Time spent with patient: 15 minutes  Musculoskeletal: Strength & Muscle Tone: within normal limits Gait & Station: normal Patient leans: N/A  Psychiatric Specialty Exam: Review of Systems  All other systems reviewed and are negative.   Blood pressure 115/80, pulse 68, temperature 98 F (36.7 C), temperature source Oral, resp. rate 18, height 5\' 8"  (1.727 m), weight 65.8 kg, SpO2 100 %.Body mass index is 22.05 kg/m.  General Appearance: Casual  Eye Contact::  Fair  Speech:  Pressured409  Volume:  Normal  Mood:  Euthymic  Affect:  Congruent  Thought Process:  Coherent and Descriptions of Associations: Intact  Orientation:  Full (Time, Place, and Person)  Thought Content:  Delusions  Suicidal Thoughts:  No  Homicidal Thoughts:  No  Memory:  Immediate;   Fair Recent;   Fair Remote;   Fair  Judgement:  Intact  Insight:  Lacking  Psychomotor Activity:  Increased  Concentration:  Fair  Recall:  Good  Fund of Knowledge:Good  Language: Good  Akathisia:  Negative  Handed:  Right  AIMS (if indicated):     Assets:  Desire for Improvement Housing Resilience Social Support  Sleep:  Number of Hours: 6.5  Cognition: WNL  ADL's:  Intact   Mental Status Per Nursing Assessment::   On Admission:  Suicidal ideation indicated by patient, Suicidal ideation indicated by others  Demographic Factors:  Male, Caucasian and Low socioeconomic status  Loss Factors: NA  Historical Factors: Impulsivity  Risk Reduction Factors:   Positive social support  Continued Clinical Symptoms:  Bipolar Disorder:   Mixed State  Cognitive Features That Contribute To Risk:  Thought constriction (tunnel vision)    Suicide Risk:  Minimal: No identifiable suicidal ideation.  Patients presenting with no risk factors but with morbid  ruminations; may be classified as minimal risk based on the severity of the depressive symptoms   Follow-up Information    [facility] Follow up.   Why: [phone] will call you to schedule an appointment.  Contact information: Carlynn Spry, LCASA [address] [place], [place] Phone: [phone] [email or web address]              Plan Of Care/Follow-up recommendations:  Activity:  ad lib  [phone], MD 08/06/2020, 1:29 PM

## 2020-08-06 NOTE — BHH Suicide Risk Assessment (Signed)
[facility] INPATIENT:  Family/Significant Other Suicide Prevention Education  Suicide Prevention Education:  Education Completed; Journee, Kohen (Mother)  [phone] (Mobile(name of family member/significant other) has been identified by the patient as the family member/significant other with whom the patient will be residing, and identified as the person(s) who will aid the patient in the event of a mental health crisis (suicidal ideations/suicide attempt).  With written consent from the patient, the family member/significant other has been provided the following suicide prevention education, prior to the and/or following the discharge of the patient.  The suicide prevention education provided includes the following:  Suicide risk factors  Suicide prevention and interventions  National Suicide Hotline telephone number  [facility] assessment telephone number  [facility] Emergency Assistance 911  [facility] and/or Residential Mobile Crisis Unit telephone number  Request made of family/significant other to:  Remove weapons (e.g., guns, rifles, knives), all items previously/currently identified as safety concern.    Remove drugs/medications (over-the-counter, prescriptions, illicit drugs), all items previously/currently identified as a safety concern.  The family member/significant other verbalizes understanding of the suicide prevention education information provided.  The family member/significant other agrees to remove the items of safety concern listed above.  Mother has no safety concerns. CSW provides update about d/c plans and follow up referral. Mom will pick pt up at 2pm.   Erin Sons 08/06/2020, 9:50 AM

## 2020-08-06 NOTE — Progress Notes (Signed)
Discharge Note:  Patient was discharged home with mother.  Patient denied SI and HI.  Denied A/V hallucinations.  Suicide prevention information given to patient who stated he understood and had no questions.  Patient stated he received all his belongings, clothing, toiletries, misc items, etc.  Patient stated he appreciated all assistance received from [facility] staff.  All required discharge information given to patient at discharge.

## 2020-08-06 NOTE — Discharge Summary (Signed)
Physician Discharge Summary Note  Patient:  Keith Guerrero is an 24 y.o., male MRN:  [id number] DOB:  [birth year] Patient phone:  [phone] (home)  Patient address:   [address] [place] [address],  Total Time spent with patient: 30 minutes  Date of Admission:  08/04/2020 Date of Discharge: 08/06/2020  Reason for Admission:  Patient is 24 year old male with past history of bipolar disorder presented to [facility], IVCed by his mom for suicidal ideation without any plan.   Principal Problem: Bipolar disorder [facility]) Discharge Diagnoses: Principal Problem:   Bipolar disorder [facility])   Past Psychiatric History: Inpatient Hospitalization in Feb 2018 with ? Diagnosis of Bipolar Disorder.  Past Medical History:  Past Medical History:  Diagnosis Date  . Pneumonia     Past Surgical History:  Procedure Laterality Date  . WISDOM TOOTH EXTRACTION     Family History:  Family History  Problem Relation Age of Onset  . Healthy Mother   . Healthy Father   . Hypertension Other    Family Psychiatric  History: Non contribitory Social History:  Social History   Substance and Sexual Activity  Alcohol Use No     Social History   Substance and Sexual Activity  Drug Use No    Social History   Socioeconomic History  . Marital status: Single    Spouse name: Not on file  . Number of children: Not on file  . Years of education: Not on file  . Highest education level: Not on file  Occupational History  . Not on file  Tobacco Use  . Smoking status: Never Smoker  . Smokeless tobacco: Never Used  Vaping Use  . Vaping Use: Never used  Substance and Sexual Activity  . Alcohol use: No  . Drug use: No  . Sexual activity: Never  Other Topics Concern  . Not on file  Social History Narrative  . Not on file   Social Determinants of Health   Financial Resource Strain:   . Difficulty of Paying Living Expenses: Not on file  Food Insecurity:   . Worried About Programme researcher, broadcasting/film/video  in the Last Year: Not on file  . Ran Out of Food in the Last Year: Not on file  Transportation Needs:   . Lack of Transportation (Medical): Not on file  . Lack of Transportation (Non-Medical): Not on file  Physical Activity:   . Days of Exercise per Week: Not on file  . Minutes of Exercise per Session: Not on file  Stress:   . Feeling of Stress : Not on file  Social Connections:   . Frequency of Communication with Friends and Family: Not on file  . Frequency of Social Gatherings with Friends and Family: Not on file  . Attends Religious Services: Not on file  . Active Member of Clubs or Organizations: Not on file  . Attends Banker Meetings: Not on file  . Marital Status: Not on file    Hospital Course:  Admission Notes (H&P)- Patient is 24 year old male with past history of bipolar disorder presented to [facility], IVCed by his mom for suicidal ideation without any plan.  Patient states he was fine and healthy until April 28, 2020 when he was bitten by a dog and he went to [facility] where he got Tdap shot. Within 10 minutes after the shot he started getting paresthesias,  tingling sensation in arm, pressure in his head and at the injection site.  He went to multiple doctors and he was diagnosed with "chronic inflammatory syndrome" but nobody could find out how to treat that. Patient is taking vitamin D and zinc for chronic inflammatory syndrome. He states he is very frustrated because of his disease and pain and he got an anger outburst recently with mom when he said "I was tired of living in pain, I wish something would happen to me" and after that his mom called police and IVCed him. He reports that he was admitted in [place] in Feb 2018 and they were thinking of bipolar disorder but they did not put him on any medication. At that time he was depressed because of bullying and had suicidal ideation. He also shared another episode when he felt suicidal in December 2019 because  his girlfriend used him for sex and left him. He reports disturbed sleep, low energy, anhedonia, hopelessness, helplessness , a lot of guilt, decreased concentration, feeling powerless, low motivation and feeling of being burden on his family. Patient states he was feeling depressed from June 2021 to August 2021 because of his chronic inflammatory syndrome but doesn't feel depressed now. Patient states he sleeps for 5 to 6 hours at night but wakes up multiple times. He states sometimes he has very high energy especially when he is in pain. He states he thinks fast and talks fast since childhood but recently after the injection he had been little slow in thinking and feels a disconnection. He states he feels  frustrated but try his best to be hopeful and believes in doing natural healing techniques such as medication, exercise, and eating healthy. Currently, patient denies any suicidal and homicidal ideation. Pt states he loves his life and doesn't want to die. He denies any visual and auditory hallucination. He does have some anxiety. He denies any paranoia.  He denies any history of verbal, physical and sexual abuse. He denies access to guns. He denies using any drugs. He drinks wine once a week at church. He states he prays a lot so that all of his sufferings go away. He states he believes in [place] Philosophy of love and healing and believes in miracles. He denies any problems with law enforcement. He is single and lives alone since June 2021. He was living with parents before June 2021. He thinks that his parents forced him to get Tdap shot and blames them for his sufferings. Patient is unemployed for one month, previously worked at [employer]. He studied History and English at [employer] and graduated in May 2021. Patient states he was planning on getting a Master's degree in intelligence services and was thinking of going to [place] but then his heath went downhill because of Tdap vaccination and chronic  inflammatory syndrome. Patient states he is interested in therapy only and will not take any medication. He states he has to give a chance to therapy first otherwise he will feel guilty. He is also afraid to start any medication because of his bad experience with Tdap.  On examination, Pt is pressured with circumstantial thought process. He is pleasant on approach. His speech is rapid and pressured with normal volume. His eye contact is good. His mood is Euthymic and affect is congruent. He is delusional. No SI, HI and AVH.  Per EMR, Additional information obtained by mother in the ED- States that pt had been overall doing well until he began hyper focusing on symptoms he was having after receiving the tetanus injection on June 12th. She  states that pt has had a couple of ED visits/mandatory holds related to suicidal ideation in the past. She states he was in [place] visiting a friend in Feb 2018 and was placed on a 3 day hold - he was diagnosed with questionable bipolar disorder at that time and told to see a psychiatrist when he got home. Pt went to see multiple providers however only saw them for 1 visit voluntarily and then would not want to go back. He was never properly diagnosed and never medicated. Mom reports that since COVID began pt starting believing that he was a "prophet" chosen by God to help those individuals who were not vaccinated as the people getting vaccinated would all be enslaved by the end of this year. Pt also believes that since he received the tetanus vaccine that he has a "toxin" inside his body that he needs to get rid of - he believes all vaccines carry this now and that he is never getting another vaccine. He has been taking multiple OTC medications to get rid of this. Mom also reports that pt watched a video online the other day about filling a metal bucket with water and wrapping it with medal to attach to an electrical socket - pt was planning to do this and put his feet in the  water which would help get rid of this toxin. Mom began worrying at that point regarding pt's safety. She also mentions that over the past couple of weeks pt has mentions multiple plans of self harm including driving off of a bridge 2-3 weeks ago and jumping off of an 8 story parking garage however pt never went through with this as he did not want God to be upset with him and go to [place]. He began mentioning 2 days ago that if you do kill yourself you will only go to [facility] for a couple of weeks and then go to wherever you desire including [place] so it wasn't that big of a deal to kill yourself which really worried mom. She was at pt's house yesterday and he was very tearful and seemed in distress which upset her. She has been trying to get him into a psychiatrist office however they are booked solid until end of October. They told her she had an option to involuntarily commit him if she was worried about his safety which mom did today.  During inpatient stay, Pt was put on Agitation Protocol. Pt was put on Hydroxyzine 25 mg PRN for Anxiety, Malox /Mylanta for indigestion, Milk of Mag 30 ml PRN for constipation andTrazodone 50 mg QHS PRN for sleep. Pt stated that he is not interested in taking any psychiatry medication. He has refused medications during inpatient stay and discussed the fact that he would rather use "natural methods" because of his religious beliefs. He didn't not fulfill criteria for any forced medications at this time. He stated he is more interested in doing meditation and breathing exercises. He wants to gets therapy on outpatient basis. During his stay Patient did not display any dangerous, violent or suicidal behavior on the unit.  Pt interacted with patients & staff appropriately, participated appropriately in the group sessions/therapies. Pt was recommended for outpatient follow-up care & medication management upon discharge. Pt is being discharged on Trileptal 75 mg BID. Phone conversation  with Mom before discharge- Mom stated that he was doing good until June 12th when he began hyper focusing on his parasthesia, tingling and pain after Tdap shot. Mom stated he  got counseling in the past but no medication was prescribed. Mom states she is not surprised that he is refusing medications as he always avoid taking any medications not even tylenol. She feels that he is going to benefit from therapist on outpatient basis but was not able to find it. She states everything was booked. Mom states when he got the tdap shot he started believing that he got implanted with a chip inside his body and he was a prophet chosen by God to help those who are not vaccinated. Mom states he believes that people who are getting vaccinated would be enslaved by the end of this year. Mom stated that it got to a point that he was not able to function properly and was crying with pain. She stated he started working in [employer] but would call off often because of pain or paraesthesia. Mom states he doesn't trust doctors and won't take any medication.  Mom states as a child he was very intelligent, got very good grades, very goal oriented and very good in academics. Mom was educated about the need of medications for his Bipolar diagnosis but she understand that we cannot force medications as he doesn't meet the criteria. Mom is ok with him discharging home today.  At the time of discharge, patient is not reporting any acute suicidal/homicidal ideations. Pt currently denies any new issues or concerns. Education and supportive counseling provided throughout her hospital stay & upon discharge.   Physical Findings: AIMS: Facial and Oral Movements Muscles of Facial Expression: None, normal Lips and Perioral Area: None, normal Jaw: None, normal Tongue: None, normal,Extremity Movements Upper (arms, wrists, hands, fingers): None, normal Lower (legs, knees, ankles, toes): None, normal, Trunk Movements Neck, shoulders, hips:  None, normal, Overall Severity Severity of abnormal movements (highest score from questions above): None, normal Incapacitation due to abnormal movements: None, normal Patient's awareness of abnormal movements (rate only patient's report): No Awareness, Dental Status Current problems with teeth and/or dentures?: No Does patient usually wear dentures?: No  CIWA:    COWS:     Musculoskeletal: Strength & Muscle Tone: within normal limits Gait & Station: normal Patient leans: N/A  Psychiatric Specialty Exam: Physical Exam Vitals and nursing note reviewed.  Constitutional:      General: He is not in acute distress.    Appearance: Normal appearance. He is normal weight. He is not ill-appearing, toxic-appearing or diaphoretic.  HENT:     Head: Normocephalic and atraumatic.  Pulmonary:     Effort: Pulmonary effort is normal.  Neurological:     Mental Status: He is alert and oriented to person, place, and time.     Review of Systems  Constitutional: Negative for activity change, appetite change, chills, fatigue and fever.  Eyes: Negative for visual disturbance.  Respiratory: Negative for chest tightness and shortness of breath.   Cardiovascular: Negative for chest pain and palpitations.  Gastrointestinal: Negative for abdominal pain, constipation, diarrhea, nausea and vomiting.  Neurological: Negative for dizziness, light-headedness and headaches.  Psychiatric/Behavioral: Negative for agitation, dysphoric mood, hallucinations and suicidal ideas. The patient is not nervous/anxious.     Blood pressure 115/80, pulse 68, temperature 98 F (36.7 C), temperature source Oral, resp. rate 18, height 5\' 8"  (1.727 m), weight 65.8 kg, SpO2 100 %.Body mass index is 22.05 kg/m.  General Appearance: Casual  Eye Contact:  Good  Speech:  Pressured Improved  Volume:  Normal  Mood:  Euthymic  Affect:  Congruent  Thought Process:  Goal Directed  and Descriptions of Associations: Tangential   Orientation:  Full (Time, Place, and Person)  Thought Content:  Delusions  Suicidal Thoughts:  No  Homicidal Thoughts:  No  Memory:  Immediate;   Good Recent;   Good Remote;   Good  Judgement:  Fair  Insight:  Lacking  Psychomotor Activity:  Normal  Concentration:  Concentration: Fair and Attention Span: Fair  Recall:  Good  Fund of Knowledge:  Good  Language:  Good  Akathisia:  Negative  Handed:  Right  AIMS (if indicated):     Assets:  Desire for Improvement Resilience  ADL's:  Intact  Cognition:  WNL  Sleep:  Number of Hours: 6.5     Have you used any form of tobacco in the last 30 days? (Cigarettes, Smokeless Tobacco, Cigars, and/or Pipes): No  Has this patient used any form of tobacco in the last 30 days? (Cigarettes, Smokeless Tobacco, Cigars, and/or Pipes) Yes, No  Blood Alcohol level:  Lab Results  Component Value Date   ETH <10 08/03/2020    Metabolic Disorder Labs:  Lab Results  Component Value Date   HGBA1C 5.0 08/05/2020   MPG 96.8 08/05/2020   No results found for: PROLACTIN Lab Results  Component Value Date   CHOL 176 08/05/2020   TRIG 58 08/05/2020   HDL 59 08/05/2020   CHOLHDL 3.0 08/05/2020   VLDL 12 08/05/2020   LDLCALC 105 (H) 08/05/2020    See Psychiatric Specialty Exam and Suicide Risk Assessment completed by Attending Physician prior to discharge.  Discharge destination:  Home  Is patient on multiple antipsychotic therapies at discharge:  No   Has Patient had three or more failed trials of antipsychotic monotherapy by history:  No  Recommended Plan for Multiple Antipsychotic Therapies: NA  Discharge Instructions    Discharge instructions   Complete by: As directed    Activity as tolerated. Diet as recommended by primary care physician. Keep all scheduled follow-up appointments as recommended.     Allergies as of 08/06/2020      Reactions   Fish Allergy Hives   Other Hives, Itching   "Sea Bass"      Medication List     STOP taking these medications   ACETYLCYSTEINE PO   omeprazole 20 MG capsule Commonly known as: PRILOSEC   ondansetron 4 MG disintegrating tablet Commonly known as: Zofran ODT   Vitamin D 50 MCG (2000 UT) tablet   ZINC PO     TAKE these medications     Indication  OXcarbazepine 150 MG tablet Commonly known as: TRILEPTAL Take 0.5 tablets (75 mg total) by mouth 2 (two) times daily.  Indication: Mood       Follow-up Information    [facility] Follow up.   Why: Carlynn SpryJames Pearce will call you to schedule an appointment.  Contact information: Landis GandyJames Pearce LCSW, LCASA [address] [place], [place] [address] Phone: [phone] [email or web address]              Follow-up recommendations:  Activity:  As tolerated Diet:  As recommended by your primary care doctor.  Comments:  Prescriptions given at discharge. Patient agreeable to plan.  Given opportunity to ask questions.  Appears to feel comfortable with discharge denies any current suicidal or homicidal thought. Patient is also instructed prior to discharge to: Take all medications as prescribed by his mental healthcare provider. Report any adverse effects and or reactions from the medicines to his outpatient provider promptly. Patient has been instructed & cautioned:  To not engage in alcohol and or illegal drug use while on prescription medicines. In the event of worsening symptoms, patient is instructed to call the crisis hotline, 911 and or go to the nearest ED for appropriate evaluation and treatment of symptoms. To follow-up with his primary care provider for your other medical issues, concerns and or health care needs.  Signed: Karsten Ro, MD 08/06/2020, 2:31 PM

## 2020-08-06 NOTE — Progress Notes (Signed)
D:  Patient sleeps good, no sleep medication.  Good appetite, normal energy level, good concentration.  Denied anxiety, depression and hopeless.  Denied withdrawals.  Denied suicide thoughts.  Denied physical pain.  Plans to talk to MD/SW about discharge.  Plans to meet with MD.   A:  Medications administered per MD orders.  Emotional support and encouragement given patient. R:  Denied SI and HI, contracts for safety.  Denied A/V hallucinations.  Safety maintained with 15 minute checks.

## 2020-08-06 NOTE — Progress Notes (Signed)
  [facility] Adult Case Management Discharge Plan :  Will you be returning to the same living situation after discharge:  Yes,  his home At discharge, do you have transportation home?: Yes,  pt mother Do you have the ability to pay for your medications: Yes,  BCBS  Release of information consent forms completed and in the chart;  Patient's signature needed at discharge.  Patient to Follow up at:  Follow-up Information    [facility] Follow up.   Why: Carlynn Spry will call you to schedule an appointment.  Contact information: Carlynn Spry LCSW, LCASA [address] [place], [place] [address] Phone: [phone] [email or web address]              Next level of care provider has access to [facility] Link:no  Safety Planning and Suicide Prevention discussed: Yes,  with mother  Have you used any form of tobacco in the last 30 days? (Cigarettes, Smokeless Tobacco, Cigars, and/or Pipes): No  Has patient been referred to the Quitline?: N/A patient is not a smoker  Patient has been referred for addiction treatment: N/A  Erin Sons, LCSW 08/06/2020, 10:51 AM

## 2020-08-06 NOTE — Progress Notes (Signed)
Recreation Therapy Notes  Date:  9.20.21 Time: 0930 Location: [address] Dayroom  Group Topic: Stress Management  Goal Area(s) Addresses:  Patient will identify positive stress management techniques. Patient will identify benefits of using stress management post d/c.  Behavioral Response: Engaged  Intervention: Stress Management  Activity:  Meditation.  LRT played a meditation that focused on making the most of your day and making the most of each moment.  Patients were to listen and follow as meditation played to fully engage in activity.    Education:  Stress Management, Discharge Planning.   Education Outcome: Acknowledges Education  Clinical Observations/Feedback: Pt attended and participated in activity.    Caroll Rancher, LRT/CTRS         Lillia Abed, Ashleen Demma A 08/06/2020 11:00 AM

## 2020-08-06 NOTE — Tx Team (Signed)
Interdisciplinary Treatment and Diagnostic Plan Update  08/06/2020 Time of Session: 9:20am Keith Guerrero MRN: [id number]  Principal Diagnosis: Bipolar disorder [facility])  Secondary Diagnoses: Principal Problem:   Bipolar disorder (HCC)   Current Medications:  Current Facility-Administered Medications  Medication Dose Route Frequency Provider Last Rate Last Admin  . acetaminophen (TYLENOL) tablet 650 mg  650 mg Oral Q6H PRN Antonieta Pert, MD      . alum & mag hydroxide-simeth (MAALOX/MYLANTA) 200-200-20 MG/5ML suspension 30 mL  30 mL Oral Q4H PRN Antonieta Pert, MD      . hydrOXYzine (ATARAX/VISTARIL) tablet 25 mg  25 mg Oral TID PRN Antonieta Pert, MD      . risperiDONE (RISPERDAL M-TABS) disintegrating tablet 2 mg  2 mg Oral Q8H PRN Antonieta Pert, MD       And  . LORazepam (ATIVAN) tablet 1 mg  1 mg Oral PRN Antonieta Pert, MD       And  . ziprasidone (GEODON) injection 20 mg  20 mg Intramuscular PRN Antonieta Pert, MD      . magnesium hydroxide (MILK OF MAGNESIA) suspension 30 mL  30 mL Oral Daily PRN Antonieta Pert, MD      . OXcarbazepine (TRILEPTAL) tablet 75 mg  75 mg Oral BID Antonieta Pert, MD      . traZODone (DESYREL) tablet 50 mg  50 mg Oral QHS PRN Antonieta Pert, MD       PTA Medications: Medications Prior to Admission  Medication Sig Dispense Refill Last Dose  . ACETYLCYSTEINE PO Take 1,200 mg by mouth daily.     . Cholecalciferol (VITAMIN D) 50 MCG (2000 UT) tablet Take 2,000 Units by mouth daily.     . Multiple Vitamins-Minerals (ZINC PO) Take 1 tablet by mouth daily.     Marland Kitchen omeprazole (PRILOSEC) 20 MG capsule Take 1 capsule (20 mg total) by mouth daily. (Patient not taking: Reported on 08/03/2020) 30 capsule 0   . ondansetron (ZOFRAN ODT) 4 MG disintegrating tablet Take 1 tablet (4 mg total) by mouth every 8 (eight) hours as needed for nausea or vomiting. (Patient not taking: Reported on 08/03/2020) 10 tablet 0     Patient  Stressors: Health problems Marital or family conflict Traumatic event  Patient Strengths: Ability for insight Capable of independent living  Treatment Modalities: Medication Management, Group therapy, Case management,  1 to 1 session with clinician, Psychoeducation, Recreational therapy.   Physician Treatment Plan for Primary Diagnosis: Bipolar disorder (HCC) Long Term Goal(s): Improvement in symptoms so as ready for discharge Improvement in symptoms so as ready for discharge   Short Term Goals: Ability to identify changes in lifestyle to reduce recurrence of condition will improve Ability to verbalize feelings will improve Ability to disclose and discuss suicidal ideas Ability to demonstrate self-control will improve Ability to identify and develop effective coping behaviors will improve Ability to maintain clinical measurements within normal limits will improve Compliance with prescribed medications will improve Ability to identify triggers associated with substance abuse/mental health issues will improve Ability to identify changes in lifestyle to reduce recurrence of condition will improve Ability to verbalize feelings will improve Ability to disclose and discuss suicidal ideas Ability to demonstrate self-control will improve Ability to identify and develop effective coping behaviors will improve Ability to maintain clinical measurements within normal limits will improve Compliance with prescribed medications will improve Ability to identify triggers associated with substance abuse/mental health issues will improve  Medication Management: Evaluate  patient's response, side effects, and tolerance of medication regimen.  Therapeutic Interventions: 1 to 1 sessions, Unit Group sessions and Medication administration.  Evaluation of Outcomes: Adequate for Discharge  Physician Treatment Plan for Secondary Diagnosis: Principal Problem:   Bipolar disorder (HCC)  Long Term Goal(s):  Improvement in symptoms so as ready for discharge Improvement in symptoms so as ready for discharge   Short Term Goals: Ability to identify changes in lifestyle to reduce recurrence of condition will improve Ability to verbalize feelings will improve Ability to disclose and discuss suicidal ideas Ability to demonstrate self-control will improve Ability to identify and develop effective coping behaviors will improve Ability to maintain clinical measurements within normal limits will improve Compliance with prescribed medications will improve Ability to identify triggers associated with substance abuse/mental health issues will improve Ability to identify changes in lifestyle to reduce recurrence of condition will improve Ability to verbalize feelings will improve Ability to disclose and discuss suicidal ideas Ability to demonstrate self-control will improve Ability to identify and develop effective coping behaviors will improve Ability to maintain clinical measurements within normal limits will improve Compliance with prescribed medications will improve Ability to identify triggers associated with substance abuse/mental health issues will improve     Medication Management: Evaluate patient's response, side effects, and tolerance of medication regimen.  Therapeutic Interventions: 1 to 1 sessions, Unit Group sessions and Medication administration.  Evaluation of Outcomes: Adequate for Discharge   RN Treatment Plan for Primary Diagnosis: Bipolar disorder (HCC) Long Term Goal(s): Knowledge of disease and therapeutic regimen to maintain health will improve  Short Term Goals: Ability to remain free from injury will improve, Ability to participate in decision making will improve and Ability to identify and develop effective coping behaviors will improve  Medication Management: RN will administer medications as ordered by provider, will assess and evaluate patient's response and provide  education to patient for prescribed medication. RN will report any adverse and/or side effects to prescribing provider.  Therapeutic Interventions: 1 on 1 counseling sessions, Psychoeducation, Medication administration, Evaluate responses to treatment, Monitor vital signs and CBGs as ordered, Perform/monitor CIWA, COWS, AIMS and Fall Risk screenings as ordered, Perform wound care treatments as ordered.  Evaluation of Outcomes: Adequate for Discharge   LCSW Treatment Plan for Primary Diagnosis: Bipolar disorder [facility]) Long Term Goal(s): Safe transition to appropriate next level of care at discharge, Engage patient in therapeutic group addressing interpersonal concerns.  Short Term Goals: Engage patient in aftercare planning with referrals and resources, Increase social support and Increase skills for wellness and recovery  Therapeutic Interventions: Assess for all discharge needs, 1 to 1 time with Social worker, Explore available resources and support systems, Assess for adequacy in community support network, Educate family and significant other(s) on suicide prevention, Complete Psychosocial Assessment, Interpersonal group therapy.  Evaluation of Outcomes: Adequate for Discharge   Progress in Treatment: Attending groups: Yes. Participating in groups: Yes. Taking medication as prescribed: No. Toleration medication: No. Family/Significant other contact made: Yes, individual(s) contacted:  mother Patient understands diagnosis: No. Discussing patient identified problems/goals with staff: Yes. Medical problems stabilized or resolved: Yes. Denies suicidal/homicidal ideation: Yes. Issues/concerns per patient self-inventory: No.   New problem(s) identified: No, Describe:  none  New Short Term/Long Term Goal(s): medication stabilization, elimination of SI thoughts, development of comprehensive mental wellness plan.   Patient Goals:  "To find a way to be balanced again"  Discharge Plan or  Barriers: Patient is to follow up with outpatient provider for therapy.  Reason for Continuation of Hospitalization: Anxiety Depression Mania  Estimated Length of Stay: Adequate for Discharge  Attendees: Patient: Keith Guerrero  08/06/2020   Physician:  08/06/2020   Nursing:  08/06/2020   RN Care Manager: 08/06/2020  Social Worker: Ruthann Cancer, LCSW 08/06/2020   Recreational Therapist:  08/06/2020   Other: Tedra Coupe, NP 08/06/2020   Other: Melba Coon, LCSW 08/06/2020   Other: 08/06/2020       Scribe for Treatment Team: Otelia Santee, LCSW 08/06/2020 11:01 AM

## 2020-08-06 NOTE — Progress Notes (Signed)
Collateral information from Mom- Mom stated that he was doing good until June 12th when he began hyper focusing on his parasthesia, tingling and pain after Tdap shot. Mom states he got counseling in the past but no medication was prescribed. Mom states she is not surprised that he is refusing medications as he always avoid taking any medications not even tylenol and believes in natural healing. She feels that he is going to benefit from therapist on outpatient basis but she was not able to find it. She states everything was booked. Mom states when he got the tdap shot he started believing that he got implanted with a chip inside his body and he was a "prophet" chosen by God to help those who are not vaccinated. Mom states he believes that people who are getting vaccinated would be enslaved by the end of this year. Mom stated that it got to a point that he was not able to function properly and was crying with pain. She stated he started working in [employer] but would call off often because of pain or paraesthesia. Mom states he doesn't trust doctors and won't take any medication. Mom states on Aug 21 he left job and went to a 8 story garage and contemplated jumping but never went through this. Mom states he is very intelligent, always got very good grades, very goal oriented and very good in academics. Mom was educated about the need of medications for his Bipolar diagnosis but she understand that we cannot force medications at this point as he doesn't meet the criteria. Mom is ok with him discharging home today.

## 2020-08-17 DIAGNOSIS — F319 Bipolar disorder, unspecified: Secondary | ICD-10-CM | POA: Diagnosis not present

## 2020-08-25 DIAGNOSIS — F319 Bipolar disorder, unspecified: Secondary | ICD-10-CM | POA: Diagnosis not present

## 2020-08-30 DIAGNOSIS — F319 Bipolar disorder, unspecified: Secondary | ICD-10-CM | POA: Diagnosis not present

## 2020-09-09 DIAGNOSIS — F319 Bipolar disorder, unspecified: Secondary | ICD-10-CM | POA: Diagnosis not present

## 2020-10-03 ENCOUNTER — Other Ambulatory Visit: Payer: Self-pay

## 2020-10-03 ENCOUNTER — Encounter (HOSPITAL_COMMUNITY): Payer: Self-pay

## 2020-10-03 ENCOUNTER — Emergency Department (EMERGENCY_DEPARTMENT_HOSPITAL)
Admission: EM | Admit: 2020-10-03 | Discharge: 2020-10-05 | Disposition: A | Discharge status: Home / Self Care | Payer: BC Managed Care – PPO | Source: Home / Self Care | Attending: Emergency Medicine | Admitting: Emergency Medicine

## 2020-10-03 DIAGNOSIS — F22 Delusional disorders: Secondary | ICD-10-CM | POA: Insufficient documentation

## 2020-10-03 DIAGNOSIS — I4519 Other right bundle-branch block: Secondary | ICD-10-CM | POA: Diagnosis not present

## 2020-10-03 DIAGNOSIS — F309 Manic episode, unspecified: Secondary | ICD-10-CM | POA: Diagnosis not present

## 2020-10-03 DIAGNOSIS — F319 Bipolar disorder, unspecified: Secondary | ICD-10-CM | POA: Insufficient documentation

## 2020-10-03 DIAGNOSIS — R45850 Homicidal ideations: Secondary | ICD-10-CM | POA: Diagnosis not present

## 2020-10-03 DIAGNOSIS — Z9119 Patient's noncompliance with other medical treatment and regimen: Secondary | ICD-10-CM | POA: Diagnosis not present

## 2020-10-03 DIAGNOSIS — Z20822 Contact with and (suspected) exposure to covid-19: Secondary | ICD-10-CM | POA: Insufficient documentation

## 2020-10-03 DIAGNOSIS — F310 Bipolar disorder, current episode hypomanic: Secondary | ICD-10-CM | POA: Diagnosis not present

## 2020-10-03 DIAGNOSIS — F312 Bipolar disorder, current episode manic severe with psychotic features: Secondary | ICD-10-CM | POA: Diagnosis not present

## 2020-10-03 DIAGNOSIS — Z9151 Personal history of suicidal behavior: Secondary | ICD-10-CM | POA: Diagnosis not present

## 2020-10-03 HISTORY — DX: Bipolar disorder, unspecified: F31.9

## 2020-10-03 LAB — ETHANOL: Alcohol, Ethyl (B): <10 mg/dL (ref <10)

## 2020-10-03 LAB — CBC
HCT: 50.7 % (ref 39.0–52.0)
Hemoglobin: 17.3 g/dL — ABNORMAL HIGH (ref 13.0–17.0)
MCH: 29.6 pg (ref 26.0–34.0)
MCHC: 34.1 g/dL (ref 30.0–36.0)
MCV: 86.8 fL (ref 80.0–100.0)
Platelets: 253 10*3/uL (ref 150–400)
RBC: 5.84 MIL/uL — ABNORMAL HIGH (ref 4.22–5.81)
RDW: 11.6 % (ref 11.5–15.5)
WBC: 10.7 10*3/uL — ABNORMAL HIGH (ref 4.0–10.5)
nRBC: 0 % (ref 0.0–0.2)

## 2020-10-03 LAB — COMPREHENSIVE METABOLIC PANEL
ALT: 25 U/L (ref 0–44)
AST: 36 U/L (ref 15–41)
Albumin: 5.7 g/dL — ABNORMAL HIGH (ref 3.5–5.0)
Alkaline Phosphatase: 68 U/L (ref 38–126)
Anion gap: 15 (ref 5–15)
BUN: 16 mg/dL (ref 6–20)
CO2: 20 mmol/L — ABNORMAL LOW (ref 22–32)
Calcium: 10.2 mg/dL (ref 8.9–10.3)
Chloride: 100 mmol/L (ref 98–111)
Creatinine, Ser: 0.86 mg/dL (ref 0.61–1.24)
GFR, Estimated: >60 mL/min (ref >60)
Glucose, Bld: 114 mg/dL — ABNORMAL HIGH (ref 70–99)
Potassium: 4 mmol/L (ref 3.5–5.1)
Sodium: 135 mmol/L (ref 135–145)
Total Bilirubin: 1.8 mg/dL — ABNORMAL HIGH (ref 0.3–1.2)
Total Protein: 8.7 g/dL — ABNORMAL HIGH (ref 6.5–8.1)

## 2020-10-03 LAB — ACETAMINOPHEN LEVEL: Acetaminophen (Tylenol), Serum: <10 ug/mL — ABNORMAL LOW (ref 10–30)

## 2020-10-03 LAB — RAPID URINE DRUG SCREEN, HOSP PERFORMED
Amphetamines: NOT DETECTED
Barbiturates: NOT DETECTED
Benzodiazepines: NOT DETECTED
Cocaine: NOT DETECTED
Opiates: NOT DETECTED
Tetrahydrocannabinol: NOT DETECTED

## 2020-10-03 LAB — SALICYLATE LEVEL: Salicylate Lvl: <7 mg/dL — ABNORMAL LOW (ref 7.0–30.0)

## 2020-10-03 NOTE — ED Provider Notes (Signed)
[facility]-EMERGENCY DEPT Provider Note   CSN: [id number] Arrival date & time: 10/03/20  2006     History No chief complaint on file.   Keith Guerrero is a 24 y.o. male.   Mental Health Problem Presenting symptoms: bizarre behavior, delusional, disorganized speech, disorganized thought process, homicidal ideas and paranoid behavior   Presenting symptoms: no hallucinations, no self-mutilation and no suicidal thoughts   Patient accompanied by:  Law enforcement Degree of incapacity (severity):  Severe Onset quality:  Gradual Timing:  Constant Progression:  Worsening Chronicity:  Recurrent Context: noncompliance   Treatment compliance:  Untreated Relieved by:  Nothing Worsened by:  Nothing Ineffective treatments:  None tried Associated symptoms: no anxiety, no chest pain and no headaches        Past Medical History:  Diagnosis Date  . Bipolar disorder (HCC)   . Pneumonia     Patient Active Problem List   Diagnosis Date Noted  . Delusional disorder (HCC) 08/04/2020  . Intermittent explosive disorder 08/04/2020  . Bipolar disorder (HCC) 08/04/2020  . Adjustment disorder with anxious mood 06/30/2015  . Chest pain, midsternal 03/22/2015  . Anxiety attack 03/22/2015  . Anxiety 03/22/2015  . Right foot pain 05/18/2014  . Fever, unspecified 05/18/2014  . Viral syndrome 05/17/2014  . BMI (body mass index), pediatric, 5% to less than 85% for age 53/06/2013  . LEG LEGNTH DISCREPANCY 04/13/2007  . PES PLANUS, CONGENITAL 04/13/2007  . DEFORMITY, FOOT NEC, CONGENITAL 04/13/2007    Past Surgical History:  Procedure Laterality Date  . WISDOM TOOTH EXTRACTION         Family History  Problem Relation Age of Onset  . Healthy Mother   . Healthy Father   . Hypertension Other     Social History   Tobacco Use  . Smoking status: Never Smoker  . Smokeless tobacco: Never Used  Vaping Use  . Vaping Use: Never used  Substance Use Topics  .  Alcohol use: No  . Drug use: No    Home Medications Prior to Admission medications   Medication Sig Start Date End Date Taking? Authorizing Provider  OXcarbazepine (TRILEPTAL) 150 MG tablet Take 0.5 tablets (75 mg total) by mouth 2 (two) times daily. Patient not taking: Reported on 10/03/2020 08/06/20   Aldean Baker, NP    Allergies    Fish allergy and Other  Review of Systems   Review of Systems  Constitutional: Negative for chills and fever.  HENT: Negative for congestion and rhinorrhea.   Respiratory: Negative for cough and shortness of breath.   Cardiovascular: Negative for chest pain and palpitations.  Gastrointestinal: Negative for diarrhea, nausea and vomiting.  Genitourinary: Negative for difficulty urinating and dysuria.  Musculoskeletal: Negative for arthralgias and back pain.  Skin: Negative for color change and rash.  Neurological: Negative for light-headedness and headaches.  Psychiatric/Behavioral: Positive for homicidal ideas and paranoia. Negative for hallucinations, self-injury and suicidal ideas. The patient is not nervous/anxious.     Physical Exam Updated Vital Signs BP 108/90 (BP Location: Left Arm)   Pulse (!) 101   Temp 99.5 F (37.5 C) (Oral)   Resp 20   SpO2 96%   Physical Exam Vitals and nursing note reviewed.  Constitutional:      General: He is not in acute distress.    Appearance: Normal appearance.  HENT:     Head: Normocephalic and atraumatic.     Nose: No rhinorrhea.  Eyes:     General:  Right eye: No discharge.        Left eye: No discharge.     Conjunctiva/sclera: Conjunctivae normal.  Cardiovascular:     Rate and Rhythm: Normal rate and regular rhythm.  Pulmonary:     Effort: Pulmonary effort is normal.     Breath sounds: No stridor.  Abdominal:     General: Abdomen is flat. There is no distension.     Palpations: Abdomen is soft.  Musculoskeletal:        General: No deformity or signs of injury.  Skin:    General:  Skin is warm and dry.  Neurological:     General: No focal deficit present.     Mental Status: He is alert. Mental status is at baseline.     Motor: No weakness.  Psychiatric:        Speech: Speech is rapid and pressured and tangential.        Behavior: Behavior normal. Behavior is cooperative.        Thought Content: Thought content is paranoid and delusional. Thought content includes homicidal ideation. Thought content does not include suicidal ideation. Thought content includes homicidal plan. Thought content does not include suicidal plan.        Judgment: Judgment is impulsive.     ED Results / Procedures / Treatments   Labs (all labs ordered are listed, but only abnormal results are displayed) Labs Reviewed  COMPREHENSIVE METABOLIC PANEL - Abnormal; Notable for the following components:      Result Value   CO2 20 (*)    Glucose, Bld 114 (*)    Total Protein 8.7 (*)    Albumin 5.7 (*)    Total Bilirubin 1.8 (*)    All other components within normal limits  SALICYLATE LEVEL - Abnormal; Notable for the following components:   Salicylate Lvl <7.0 (*)    All other components within normal limits  ACETAMINOPHEN LEVEL - Abnormal; Notable for the following components:   Acetaminophen (Tylenol), Serum <10 (*)    All other components within normal limits  CBC - Abnormal; Notable for the following components:   WBC 10.7 (*)    RBC 5.84 (*)    Hemoglobin 17.3 (*)    All other components within normal limits  RESPIRATORY PANEL BY RT PCR (FLU A&B, COVID)  ETHANOL  RAPID URINE DRUG SCREEN, HOSP PERFORMED    EKG EKG Interpretation  Date/Time:  Wednesday October 03 2020 22:38:50 EST Ventricular Rate:  79 PR Interval:  160 QRS Duration: 106 QT Interval:  356 QTC Calculation: 408 R Axis:   37 Text Interpretation: Normal sinus rhythm with sinus arrhythmia Incomplete right bundle branch block Borderline ECG When compared with ECG of 08/04/2020, No significant change was found  Confirmed by Dione Booze ([id number]) on 10/04/2020 6:26:26 AM   Radiology No results found.  Procedures Procedures (including critical care time)  Medications Ordered in ED Medications - No data to display  ED Course  I have reviewed the triage vital signs and the nursing notes.  Pertinent labs & imaging results that were available during my care of the patient were reviewed by me and considered in my medical decision making (see chart for details).    MDM Rules/Calculators/A&P                          IPatient showing signs of decompensated mania, delusional behavior flight of ideas, pressured speech.  IVC in place, first exam completed.  Screening laboratory analysis fairly unremarkable.  No significant derangements.  No coingestions.  Psychiatry consulted for evaluation.  Patient understands this process.  EKG reviewed by me shows no acute ischemic change interval abnormality or arrhythmia, does have early repolarization.   Final Clinical Impression(s) / ED Diagnoses Final diagnoses:  [facility])    Rx / DC Orders ED Discharge Orders    None       Sabino Donovan, MD 10/04/20 1054

## 2020-10-03 NOTE — BH Assessment (Addendum)
Clinician spoke to Junior, RN to fax [phone])  pt's IVC paperwork. Clinician to complete pt's assessment once IVC paperwork is received.    Redmond Pulling, MS, [facility], [facility] Triage Specialist [phone]

## 2020-10-03 NOTE — BH Assessment (Signed)
TTS Clinician attempted multiple times, no one answered monitor. TTS clinician will move forward to next patient and then make another attempt.

## 2020-10-03 NOTE — ED Triage Notes (Signed)
Pt BIB [facility]. Pt was IVC'd by family. PD has been chasing pt all day. Pt expressed that he wanted to get a gun and hurt people. Pt is non-compliant with medication for bipolar disorder. Pt has also been experiencing delusions. Pt refusing to speak to therapist regarding his behavior.

## 2020-10-04 DIAGNOSIS — R45850 Homicidal ideations: Secondary | ICD-10-CM | POA: Diagnosis not present

## 2020-10-04 DIAGNOSIS — F309 Manic episode, unspecified: Secondary | ICD-10-CM | POA: Diagnosis not present

## 2020-10-04 LAB — RESPIRATORY PANEL BY RT PCR (FLU A&B, COVID)
Influenza A by PCR: NEGATIVE
Influenza B by PCR: NEGATIVE
SARS Coronavirus 2 by RT PCR: NEGATIVE

## 2020-10-04 NOTE — BH Assessment (Signed)
[facility] Assessment Progress Note  Per Berneice Heinrich, NP, this pt requires psychiatric hospitalization.  Pt presents under IVC initiated by pt's mother, and upheld by EDP Kennis Carina, MD.  Pt has been submitted to both [facility] and to [facility] for consideration for admission.  Gretta Arab, RN, [facility] reports that [facility] does not have an appropriate bed at this time.  Decision from [facility] is pending as of this writing.  EDP Meridee Score, MD and pt's nurse, Waynetta Sandy, have been notified.  Doylene Canning, [place] [facility] Coordinator [phone]

## 2020-10-04 NOTE — ED Notes (Signed)
Pt. Given turkey sandwich and milk.

## 2020-10-04 NOTE — ED Notes (Signed)
Pt has been alert all shift. Pt cooperative. Pt restless at times. Guarded with assessment.

## 2020-10-04 NOTE — BH Assessment (Addendum)
Comprehensive Clinical Assessment (CCA) Screening, Triage and Referral Note  10/04/2020 Keith Guerrero [id number]   Keith Guerrero is a 24 year old male presenting under IVC due to bizarre and delusional behaviors. Throughout entire assessment, patient displayed manic behaviors, paranoia, rapid speech and rambling. Patient reported he is off his medicatons. Pateint was preoccupied with "5G technologies through microwave applications". Patient denied prior psych inpatient treatment, however per chart patient was inpatient at [facility] 08/04/20. Patient denied past suicide attempts and self-harming behaviors. Patient reported 7 hours sleep and good appetite. Patient denied SI, psychosis and alcohol/drug usage.  Patient denied receiving any outpatient mental health services. Patient denied being on any psych medications. Per IVC, patient does have psych history and has a psych meds regimen.  PER TRIAGE, Pt BIB [facility]. Pt was IVC'd by family. PD has been chasing pt all day. Pt expressed that he wanted to get a gun and hurt people. Pt is non-compliant with medication for bipolar disorder. Pt has also been experiencing delusions. Pt refusing to speak to therapist regarding his behavior.    PER IVC Previously diagnosed with bipolar disorder, paranoia. Respndent has been prescribed medication but is non- compliant with his medication regimen. Respondent is in manic mode according tho his family. He suffers from delusions that aliens are going to use 5G networks to activate vaccines to take over the world. He also stated that the chancellor at [employer] needs to be assassinate due to the chancellor encouraging students to be vaccinated. Respondent is now driving around [place] believing that the he has been compromised. Respondent is refusing to speak to counselor or therapist regarding his paranoia/actions this morning respondent also heard voicemail from [facility] police after they were made aware of his threat against chancellor  family is greatly concerned for his well being until he can be evaluated.   Disposition Reola Calkins, RN recommends inpatient treatment. Per Hassie Bruce, patient accepted for morning admission. Morning AC will coordinate. RN informed of disposition.  Chief Compliaint Visit Diagnosis: Bipolar and Paranoia   Patient Reported Information How did you hear about [place]? Family/Friend   Referral name: No data recorded  Referral phone number: No data recorded Whom do you see for routine medical problems? No data recorded  Practice/Facility Name: No data recorded  Practice/Facility Phone Number: No data recorded  Name of Contact: No data recorded  Contact Number: No data recorded  Contact Fax Number: No data recorded  Prescriber Name: No data recorded  Prescriber Address (if known): No data recorded What Is the Reason for Your Visit/Call Today? uta  How Long Has This Been Causing You Problems? <Week  Have You Recently Been in Any Inpatient Treatment (Hospital/Detox/Crisis Center/28-Day Program)? Yes   Name/Location of Program/Hospital:No data recorded  How Long Were You There? No data recorded  When Were You Discharged? No data recorded Have You Ever Received Services From [facility] Before? No   Who Do You See at [facility]? No data recorded Have You Recently Had Any Thoughts About Hurting Yourself? Yes   Are You Planning to Commit Suicide/Harm Yourself At This time?  No  Have you Recently Had Thoughts About Hurting Someone [name]? No   Explanation: No data recorded Have You Used Any Alcohol or Drugs in the Past 24 Hours? No   How Long Ago Did You Use Drugs or Alcohol?  No data recorded  What Did You Use and How Much? No data recorded What Do You Feel Would Help You the Most Today? Assessment  Only  Do You Currently Have a Therapist/Psychiatrist? No   Name of Therapist/Psychiatrist: No data recorded  Have You Been Recently Discharged From Any Office Practice or Programs?  No   Explanation of Discharge From Practice/Program:  No data recorded    CCA Screening Triage Referral Assessment Type of Contact: Tele-Assessment   Is this Initial or Reassessment? Initial Assessment   Date Telepsych consult ordered in [facility]:  10/04/20   Time Telepsych consult ordered in [facility]:  1628  Patient Reported Information Reviewed? Yes   Patient Left Without Being Seen? No data recorded  Reason for Not Completing Assessment: No data recorded Collateral Involvement: none reported  Does Patient Have a Court Appointed Legal Guardian? No data recorded  Name and Contact of Legal Guardian:  No data recorded If Minor and Not Living with Parent(s), Who has Custody? No data recorded Is CPS involved or ever been involved? Never  Is APS involved or ever been involved? Never  Patient Determined To Be At Risk for Harm To Self or Others Based on Review of Patient Reported Information or Presenting Complaint? Yes, for Self-Harm   Method: No data recorded  Availability of Means: No data recorded  Intent: No data recorded  Notification Required: No data recorded  Additional Information for Danger to Others Potential:  No data recorded  Additional Comments for Danger to Others Potential:  No data recorded  Are There Guns or Other Weapons in Your Home?  No data recorded   Types of Guns/Weapons: No data recorded   Are These Weapons Safely Secured?                              No data recorded   Who Could Verify You Are Able To Have These Secured:    No data recorded Do You Have any Outstanding Charges, Pending Court Dates, Parole/Probation? No data recorded Contacted To Inform of Risk of Harm To Self or Others: Guardian/MH POA:  Location of Assessment: [facility]  Does Patient Present under Involuntary Commitment? Yes  IVC Papers Initial File Date: 10/03/2020  [place] of Residence: [place]  Patient Currently Receiving the Following Services: CD--IOP (Intensive Chemical Dependency  Program);Medication Management   Determination of Need: Emergent (2 hours)   Options For Referral: Chemical Dependency Intensive Outpatient Therapy (CDIOP);Medication Management   Burnetta Sabin, [facility]

## 2020-10-04 NOTE — ED Provider Notes (Signed)
Emergency Medicine Observation Re-evaluation Note  Keith Guerrero is a 24 y.o. male, seen on rounds today.  Pt initially presented to the ED for complaints of No chief complaint on file. Currently, the patient is pacing in his room although is appropriate and cooperative.  Physical Exam  BP 124/77 (BP Location: Left Arm)   Pulse 92   Temp 98.2 F (36.8 C) (Oral)   Resp 20   SpO2 99%  Physical Exam General: No acute distress Cardiac: Regular rate and rhythm Lungs: Clear to auscultation Psych: Calm and cooperative  ED Course / MDM  EKG:EKG Interpretation  Date/Time:  Wednesday October 03 2020 22:38:50 EST Ventricular Rate:  79 PR Interval:  160 QRS Duration: 106 QT Interval:  356 QTC Calculation: 408 R Axis:   37 Text Interpretation: Normal sinus rhythm with sinus arrhythmia Incomplete right bundle branch block Borderline ECG When compared with ECG of 08/04/2020, No significant change was found Confirmed by Dione Booze ([id number]) on 10/04/2020 6:26:26 AM    I have reviewed the labs performed to date as well as medications administered while in observation.  Recent changes in the last 24 hours include behavioral health evaluation, recommending inpatient bed hopefully [facility].  Plan  Current plan is for waiting for bed assignment at [facility]. Patient is under full IVC at this time.   Terrilee Files, MD 10/05/20 [phone]

## 2020-10-05 ENCOUNTER — Other Ambulatory Visit: Payer: Self-pay | Admitting: Psychiatry

## 2020-10-05 ENCOUNTER — Inpatient Hospital Stay (HOSPITAL_COMMUNITY)
Admission: AD | Admit: 2020-10-05 | Discharge: 2020-10-16 | DRG: 885 | Payer: BC Managed Care – PPO | Source: Intra-hospital | Attending: Psychiatry | Admitting: Psychiatry

## 2020-10-05 ENCOUNTER — Other Ambulatory Visit: Payer: Self-pay

## 2020-10-05 ENCOUNTER — Encounter (HOSPITAL_COMMUNITY): Payer: Self-pay | Admitting: Psychiatry

## 2020-10-05 DIAGNOSIS — Z20822 Contact with and (suspected) exposure to covid-19: Secondary | ICD-10-CM | POA: Diagnosis present

## 2020-10-05 DIAGNOSIS — F310 Bipolar disorder, current episode hypomanic: Secondary | ICD-10-CM | POA: Diagnosis not present

## 2020-10-05 DIAGNOSIS — F309 Manic episode, unspecified: Secondary | ICD-10-CM | POA: Diagnosis not present

## 2020-10-05 DIAGNOSIS — F312 Bipolar disorder, current episode manic severe with psychotic features: Secondary | ICD-10-CM | POA: Diagnosis not present

## 2020-10-05 DIAGNOSIS — Z9119 Patient's noncompliance with other medical treatment and regimen: Secondary | ICD-10-CM | POA: Diagnosis not present

## 2020-10-05 DIAGNOSIS — Z9151 Personal history of suicidal behavior: Secondary | ICD-10-CM | POA: Diagnosis not present

## 2020-10-05 DIAGNOSIS — F319 Bipolar disorder, unspecified: Secondary | ICD-10-CM | POA: Diagnosis present

## 2020-10-05 DIAGNOSIS — R45850 Homicidal ideations: Secondary | ICD-10-CM | POA: Diagnosis present

## 2020-10-05 MED ORDER — ZIPRASIDONE MESYLATE 20 MG IM SOLR: 20.0000 mg | Freq: Four times a day (QID) | INTRAMUSCULAR | Status: AC | PRN

## 2020-10-05 MED ORDER — MAGNESIUM HYDROXIDE 400 MG/5ML PO SUSP: 30.0000 mL | Freq: Every day | ORAL | Status: DC | PRN

## 2020-10-05 MED ORDER — LORAZEPAM 1 MG PO TABS: 1.0000 mg | ORAL_TABLET | ORAL | Status: AC | PRN

## 2020-10-05 MED ORDER — ALUM & MAG HYDROXIDE-SIMETH 200-200-20 MG/5ML PO SUSP: 30.0000 mL | ORAL | Status: DC | PRN

## 2020-10-05 MED ORDER — HYDROXYZINE HCL 25 MG PO TABS
25.0000 mg | ORAL_TABLET | Freq: Three times a day (TID) | ORAL | Status: DC | PRN
  Filled 2020-10-05: qty 1

## 2020-10-05 MED ORDER — TRAZODONE HCL 50 MG PO TABS: 50.0000 mg | ORAL_TABLET | Freq: Every evening | ORAL | Status: DC | PRN

## 2020-10-05 MED ORDER — OLANZAPINE 5 MG PO TBDP: 5.0000 mg | ORAL_TABLET | Freq: Three times a day (TID) | ORAL | Status: DC | PRN

## 2020-10-05 MED ORDER — ACETAMINOPHEN 325 MG PO TABS: 650.0000 mg | ORAL_TABLET | Freq: Four times a day (QID) | ORAL | Status: DC | PRN

## 2020-10-05 NOTE — Progress Notes (Signed)
Patient was admitted on involuntary basis. On admission, [employer] served Order to [employer] and request that they be notified when patient is either discharged on transferred to another facility. The number they request to call is [phone].

## 2020-10-05 NOTE — Progress Notes (Signed)
   10/05/20 2245  Psych Admission Type (Psych Patients Only)  Admission Status Involuntary  Psychosocial Assessment  Patient Complaints None  Eye Contact Brief  Facial Expression Flat  Affect Flat  Speech Pressured  Interaction Assertive  Motor Activity Other (Comment) (wnl)  Appearance/Hygiene Unremarkable  Behavior Characteristics Cooperative  Mood Preoccupied  Thought Process  Coherency Tangential  Content WDL  Delusions None reported or observed  Perception WDL  Hallucination None reported or observed  Judgment Impaired  Confusion None  Danger to Self  Current suicidal ideation? Denies  Danger to Others  Danger to Others None reported or observed   Pt sitting in dayroom with others but not interacting. Pt denies SI, HI, AVH and pain. Pt very robotic in speech when answering questions.

## 2020-10-05 NOTE — Tx Team (Signed)
Initial Treatment Plan 10/05/2020 6:08 PM [name] AST:[id number]    PATIENT STRESSORS: Marital or family conflict Medication change or noncompliance Other: Chronic mental illness   PATIENT STRENGTHS: Metallurgist fund of knowledge Physical Health Supportive family/friends   PATIENT IDENTIFIED PROBLEMS: Psychosis      "work on the guardianship paperwork"                DISCHARGE CRITERIA:  Improved stabilization in mood, thinking, and/or behavior Need for constant or close observation no longer present Reduction of life-threatening or endangering symptoms to within safe limits Verbal commitment to aftercare and medication compliance  PRELIMINARY DISCHARGE PLAN: Outpatient therapy Medication management  PATIENT/FAMILY INVOLVEMENT: This treatment plan has been presented to and reviewed with the patient, SHAYNE DIGUGLIELMO.  The patient and family have been given the opportunity to ask questions and make suggestions.  Levin Bacon, RN 10/05/2020, 6:08 PM
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
## 2020-10-05 NOTE — BH Assessment (Addendum)
BHH Assessment Progress Note  Per Berneice Heinrich, NP, this pt requires psychiatric hospitalization.  Brook McNichol, RN, [facility] has assigned pt to [facility] Rm [id number]; [facility]HH will be ready to receive pt after 15:30.  Pt presents under IVC initiated by pt's father, and upheld by EDP Cherlynn Perches, MD, and IVC documents have been faxed to [facility].  EDP Arby Barrette, MD and pt's nurse, Christeen Douglas, have been notified, and Christeen Douglas agrees to call report to [phone].  Pt is to be transported via [employer].   Doylene Canning, [place] [facility] Coordinator [phone]
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
## 2020-10-05 NOTE — ED Notes (Signed)
Pt. Laid down to go to sleep. Agreed to get up at 6:45am to take vitals.

## 2020-10-05 NOTE — ED Notes (Signed)
Tele-psyc machine placed at bedside.

## 2020-10-06 DIAGNOSIS — F312 Bipolar disorder, current episode manic severe with psychotic features: Secondary | ICD-10-CM | POA: Diagnosis not present

## 2020-10-06 MED ORDER — QUETIAPINE FUMARATE 50 MG PO TABS
50.0000 mg | ORAL_TABLET | Freq: Every day | ORAL | Status: DC
  Administered 2020-10-06: 50 mg via ORAL
  Filled 2020-10-06 (×4): qty 1

## 2020-10-06 MED ORDER — OXCARBAZEPINE 300 MG PO TABS
300.0000 mg | ORAL_TABLET | Freq: Two times a day (BID) | ORAL | Status: DC
  Administered 2020-10-07: 300 mg via ORAL
  Filled 2020-10-06 (×5): qty 1

## 2020-10-06 MED ORDER — QUETIAPINE FUMARATE 25 MG PO TABS
25.0000 mg | ORAL_TABLET | Freq: Every day | ORAL | Status: DC
  Filled 2020-10-06 (×2): qty 1

## 2020-10-06 MED ORDER — OXCARBAZEPINE 150 MG PO TABS
150.0000 mg | ORAL_TABLET | Freq: Two times a day (BID) | ORAL | Status: DC
  Administered 2020-10-06: 150 mg via ORAL
  Filled 2020-10-06 (×4): qty 1

## 2020-10-06 NOTE — Progress Notes (Signed)
Patient rates depression 0/10, anxiety 1/10 and hopelessness 1/10 (10 being worst). He denies SI/HI. He denies AVH. He reports good sleep last night. He appears to be preoccupied about the guardianship petition and requested to speak with someone in regards to an upcoming court date. The CSW was made aware and she spoke with the patient. The patient presents with rapid speech and a flight of ideas. At times, he has to be redirected to stay on topic as he goes off on tangents before answering direct questions. He denies AVH and does not appear to be responding to internal or external stimuli.   Orders reviewed. Vital signs reviewed. Verbal support provided. 15 minute checks performed for safety.   Patient compliant with attending groups. He is compliant with taking medications and denies any side effects to the new medications.
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
## 2020-10-06 NOTE — BHH Counselor (Signed)
Clinical Social Work Note  Met with patient and other staff to explain the petitions for guardianship and upcoming hearing with which he has been served.  He was quite concerned about the order for a Guardian ad Litem, as he did not understand that person's role.  He expressed understanding that the [facility] is to represent his interests and gave consent for CSW to talk with that person, Ramiro Harvest [phone].  A voicemail message was left asking for a call back to this CSW on Sunday 11/21 or to weekday CSW on Monday 11/22, leaving both numbers.    Selmer Dominion, LCSW 10/06/2020, 5:24 PM
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
## 2020-10-06 NOTE — BHH Counselor (Signed)
Adult Comprehensive Assessment  Patient ID: Keith Guerrero, male   DOB: [birth year], 24 y.o.   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 Physical health (include injuries & life threatening diseases): Denies stressors currently, previously was stressful because of allergic reaction to tetanus shot. Social relationships: Denies stressors Substance abuse: Denies ongoing problems, states he recently drank more heavily than usual. Bereavement / Loss: Denies stressors  Living/Environment/Situation:  Living Arrangements: Alone Living conditions (as described by patient or guardian): Good, fixing it up with painting and such Who else lives in the home?: Alone How long has patient lived in current situation?: 5 months What is atmosphere in current home: Comfortable  Family History:  Marital status: Single Are you sexually active?: No What is your sexual orientation?: heterosexual Has your sexual activity been affected by drugs, alcohol, medication, or emotional stress?: Denies Does patient have children?: No  Childhood History:  Additional childhood history information: Stated his childhood was "normal" until the age of about 48-9 and stated at this age [place] and his father began carrying a gun  around, had a safe room in their house, has bullet proof glass put in, etc.. Patient stated he was bullied during high school and felt that teachers/principal were aware of this and let this occur. Description of patient's relationship with caregiver when they were a child: Stated he was close with his father and mother. Stated his mother understood his love language and his father did not. Patient's description of current relationship with people who raised him/her: Good with mother, less so with father. How were you disciplined when you got in trouble as a child/adolescent?: "Never disciplined" Does patient have siblings?: Yes Number of Siblings: 2 Description of patient's current relationship with siblings: Has a younger brother and sister and states their relationship is distant Did patient suffer any verbal/emotional/physical/sexual abuse as a child?: No Did patient suffer from severe childhood neglect?: No Has patient ever been sexually abused/assaulted/raped as an adolescent or adult?: No Was the patient ever a victim of a crime or a disaster?: No Witnessed domestic violence?: No Has patient been affected by domestic violence as an adult?: No  Education:  Highest grade of school patient has completed: Graduated from college at [employer] in May 2021 Currently a student?: No Learning disability?: No  Employment/Work Situation:   Employment situation: Employed 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 How long has patient been employed?: Just got hired Patient's job has been impacted by current illness: Yes Describe how patient's job has been impacted: In hospital What is the longest time patient has a held a job?: 2 months 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  Recently had a 3-day bout of "copious drinking" and thinks when he went out to a club with a friend that maybe his drink was spiked. Alcohol/Substance Abuse Treatment Hx: Denies past history Has alcohol/substance abuse ever caused legal problems?: No  Social Support System:   Patient's Community Support System: Good Describe Community Support System: Mother, grandparents, neighbor, friends Type of faith/religion: Recently joined the [employer].  Was raised with Jewish father and Ephriam Knuckles mother. How does patient's faith help to cope with current illness?: "Single most important thing in life is truth and love."  Leisure/Recreation:   Do You Have Hobbies?: Yes Leisure and Hobbies: Archery  Strengths/Needs:   What is the patient's perception of their strengths?: Faith Patient states they can use these personal strengths during their treatment to contribute to their recovery: UTA Patient states these barriers may affect/interfere with their treatment: N/A Patient states these barriers may affect their return to the community: N/A Other important information patient would like considered in planning for their treatment: N/A  Discharge Plan:   Currently receiving community mental health services: Yes (From Whom) (Has been receiving therapy from Carlynn Spry at [facility]) Patient states concerns and preferences for aftercare planning are: Wants to continue in therapy with Carlynn Spry at [facility], would like medication management through primary care physician Creola Corn at [facility] Patient states they will know when they are safe and ready for discharge when: understands what is going on in his life Does patient have access to transportation?: Yes Does patient have financial barriers related to discharge medications?:  No Will patient be returning to same living situation after discharge?: Yes  Summary/Recommendations:   Summary and Recommendations (to be completed by the evaluator): Patient is a 24yo male readmitted under IVC with bizarre and delusional behaviors after making a threat toward the chancellor of [employer], which is where he graduated from in May 2021.  He was frightened and ran from the police for a prolonged period of time before finally being trapped in his car and surrendering. Patient was hospitalized at [facility] in 07/2020.  He sees a Paramedic at [facility] and would like to continue.  He states he can get medication management from his primary care physician Creola Corn at [facility].  He denies customary alcohol use but was drinking heavily prior to this hospitalization for a period of 3-4 days.  He reports that his parents have petitioned for temporary guardianship of him and his court date is on Monday 11/22.  He is greatly concerned about that procedure.  There is a court order in his chart stating that the hospital staff MUST call at the time of patient's discharge or transfer to another facility.  Patient will benefit from crisis stabilization, medication evaluation, group therapy and psychoeducation, in addition to case management for discharge planning.  At discharge it is recommended that Patient adhere to the established discharge plan and continue in treatment.  Lynnell Chad. 10/06/2020

## 2020-10-06 NOTE — H&P (Signed)
Psychiatric Admission Assessment Adult  Patient Identification: ISAC LINCKS MRN:  [id number] Date of Evaluation:  10/06/2020 Chief Complaint:  Bipolar disorder (HCC) [F31.9] Bipolar 1 disorder (HCC) [F31.9] Principal Diagnosis: Bipolar affective disorder, manic, severe, with psychotic behavior (HCC) Diagnosis:  Principal Problem:   Bipolar affective disorder, manic, severe, with psychotic behavior (HCC)  History of Present Illness: Patient is seen and examined.  Patient is a 24 year old male with a past psychiatric history significant for probable bipolar disorder who presented to the [facility] emergency department on 10/03/2020 after a bizarre incident with police as well as apparently threatening to kill the [place] of the [place] [employer] [place].  The patient had a previous admission to our facility on 08/04/2020.  I assessed the patient.  He was noted to be manic at that time.  But unfortunately he refused medications, and was not suicidal, homicidal or psychotic and we were unable to force medications on him.  He was discharged home.  He stated that after his discharge he did follow-up with therapy, and he found that productive.  He went into a long discussion of the fact that he had not seen his therapist in a while, had gone to see a friend in [place], had drank alcohol, and feels as though the one beer that he drank either was too much for him, or it had some substance in it as well.  Then began the events as noted previously which included talking to his friends about the threat to harm the [place] [facility], which led to worsening paranoia which eventually led to a car chase after an involuntary commitment for homicidal ideation.  On examination today he denies suicidality or homicidality.  He is pressured and at times tangential.  He is willing to take medication and has some recognition of the seriousness of the events that have taken  place.  He was admitted to the hospital for evaluation and stabilization.  Associated Signs/Symptoms: Depression Symptoms:  anhedonia, insomnia, psychomotor agitation, fatigue, anxiety, disturbed sleep, Duration of Depression Symptoms: No data recorded (Hypo) Manic Symptoms:  Delusions, Distractibility, Impulsivity, Irritable Mood, Labiality of Mood, Anxiety Symptoms:  Excessive Worry, Psychotic Symptoms:  Denied Duration of Psychotic Symptoms: No data recorded PTSD Symptoms: Negative Total Time spent with patient: 45 minutes  Past Psychiatric History: Patient had a recent psychiatric hospitalization in our facility on 08/04/2020.  He also had a previous psychiatric admission in February 2018 at a different facility.  He has been diagnosed with bipolar disorder in the past.  On his last hospitalization here medications were recommended, but he refused at that time.  He was not suicidal, homicidal or acutely psychotic and forced medications were unable to be obtained and he was discharged to home with follow-up scheduled.  Is the patient at risk to self? Yes.    Has the patient been a risk to self in the past 6 months? Yes.    Has the patient been a risk to self within the distant past? No.  Is the patient a risk to others? Yes.    Has the patient been a risk to others in the past 6 months? No.  Has the patient been a risk to others within the distant past? No.   Prior Inpatient Therapy:   Prior Outpatient Therapy:    Alcohol Screening: 1. How often do you have a drink containing alcohol?: Never 2. How many drinks containing alcohol do you have on a typical day when you  are drinking?: 1 or 2 3. How often do you have six or more drinks on one occasion?: Never AUDIT-C Score: 0 9. Have you or someone else been injured as a result of your drinking?: No 10. Has a relative or friend or a doctor or another health worker been concerned about your drinking or suggested you cut down?:  No Alcohol Use Disorder Identification Test Final Score (AUDIT): 0 Alcohol Brief Interventions/Follow-up: AUDIT Score <7 follow-up not indicated Substance Abuse History in the last 12 months:  No. Consequences of Substance Abuse: Negative Previous Psychotropic Medications: Yes  Psychological Evaluations: Yes  Past Medical History:  Past Medical History:  Diagnosis Date  . Bipolar disorder (HCC)   . Pneumonia     Past Surgical History:  Procedure Laterality Date  . WISDOM TOOTH EXTRACTION     Family History:  Family History  Problem Relation Age of Onset  . Healthy Mother   . Healthy Father   . Hypertension Other    Family Psychiatric  History: Reportedly negative Tobacco Screening: Have you used any form of tobacco in the last 30 days? (Cigarettes, Smokeless Tobacco, Cigars, and/or Pipes): No Social History:  Social History   Substance and Sexual Activity  Alcohol Use No     Social History   Substance and Sexual Activity  Drug Use No    Additional Social History: Marital status: Single Are you sexually active?: No What is your sexual orientation?: heterosexual Has your sexual activity been affected by drugs, alcohol, medication, or emotional stress?: Denies Does patient have children?: No                         Allergies:   Allergies  Allergen Reactions  . Fish Allergy Hives  . Other Hives and Itching    "Sea Bass"   Lab Results: No results found for this or any previous visit (from the past 48 hour(s)).  Blood Alcohol level:  Lab Results  Component Value Date   ETH <10 10/03/2020   ETH <10 08/03/2020    Metabolic Disorder Labs:  Lab Results  Component Value Date   HGBA1C 5.0 08/05/2020   MPG 96.8 08/05/2020   No results found for: PROLACTIN Lab Results  Component Value Date   CHOL 176 08/05/2020   TRIG 58 08/05/2020   HDL 59 08/05/2020   CHOLHDL 3.0 08/05/2020   VLDL 12 08/05/2020   LDLCALC 105 (H) 08/05/2020    Current  Medications: Current Facility-Administered Medications  Medication Dose Route Frequency Provider Last Rate Last Admin  . acetaminophen (TYLENOL) tablet 650 mg  650 mg Oral Q6H PRN Antonieta Pert, MD      . alum & mag hydroxide-simeth (MAALOX/MYLANTA) 200-200-20 MG/5ML suspension 30 mL  30 mL Oral Q4H PRN Antonieta Pert, MD      . hydrOXYzine (ATARAX/VISTARIL) tablet 25 mg  25 mg Oral TID PRN Antonieta Pert, MD      . OLANZapine zydis (ZYPREXA) disintegrating tablet 5 mg  5 mg Oral Q8H PRN Antonieta Pert, MD       And  . LORazepam (ATIVAN) tablet 1 mg  1 mg Oral Q4H PRN Antonieta Pert, MD       And  . ziprasidone (GEODON) injection 20 mg  20 mg Intramuscular Q6H PRN Antonieta Pert, MD      . magnesium hydroxide (MILK OF MAGNESIA) suspension 30 mL  30 mL Oral Daily PRN Jola Babinski Marlane Mingle, MD      . [  START ON 10/07/2020] Oxcarbazepine (TRILEPTAL) tablet 300 mg  300 mg Oral BID Charm Rings, NP      . QUEtiapine (SEROQUEL) tablet 50 mg  50 mg Oral QHS Charm Rings, NP      . traZODone (DESYREL) tablet 50 mg  50 mg Oral QHS PRN Antonieta Pert, MD       PTA Medications: Medications Prior to Admission  Medication Sig Dispense Refill Last Dose  . OXcarbazepine (TRILEPTAL) 150 MG tablet Take 0.5 tablets (75 mg total) by mouth 2 (two) times daily. (Patient not taking: Reported on 10/03/2020) 60 tablet 0     Musculoskeletal: Strength & Muscle Tone: within normal limits Gait & Station: normal Patient leans: N/A  Psychiatric Specialty Exam: Physical Exam Vitals and nursing note reviewed.  Constitutional:      Appearance: Normal appearance.  HENT:     Head: Normocephalic and atraumatic.  Pulmonary:     Effort: Pulmonary effort is normal.  Neurological:     General: No focal deficit present.     Mental Status: He is alert and oriented to person, place, and time.     Review of Systems  Blood pressure 130/85, pulse 91, temperature 98.4 F (36.9 C),  temperature source Oral, resp. rate 18, height 5\' 7"  (1.702 m), weight 68.9 kg, SpO2 100 %.Body mass index is 23.81 kg/m.  General Appearance: Casual  Eye Contact:  Good  Speech:  Pressured  Volume:  Increased  Mood:  Anxious and Dysphoric  Affect:  Labile  Thought Process:  Goal Directed and Descriptions of Associations: Tangential  Orientation:  Full (Time, Place, and Person)  Thought Content:  Rumination and Tangential  Suicidal Thoughts:  No  Homicidal Thoughts:  No  Memory:  Immediate;   Fair Recent;   Fair Remote;   Fair  Judgement:  Impaired  Insight:  Fair  Psychomotor Activity:  Increased  Concentration:  Concentration: Fair  Recall:  of Knowledge:  Fair  Language:  Good  Akathisia:  Negative  Handed:  Right  AIMS (if indicated):     Assets:  Desire for Improvement Housing Resilience Social Support Talents/Skills Transportation  ADL's:  Intact  Cognition:  WNL  Sleep:  Number of Hours: 6.75    Treatment Plan Summary: Daily contact with patient to assess and evaluate symptoms and progress in treatment, Medication management and Plan : Patient is seen and examined.  Patient is a 24 year old male with the above-stated past psychiatric history who was admitted secondary to bipolar mania.  He will be admitted to the hospital.  He will be integrated in the milieu.  He will be encouraged to attend groups.  He is willing to take medications at this time.  We will start Trileptal 150 mg p.o. twice daily and titrate during the course of hospitalization.  We will also start Seroquel 25 mg p.o. nightly and again titrate that during the course of the hospitalization.  Review of his admission laboratories reveal an elevated protein at 8.7 and a total bilirubin of 1.8.  The rest of his electrolytes including liver function enzymes were normal.  His CBC was essentially normal with a mild elevation of white count at 10.7, his red blood cell count at 5.84 and hemoglobin at  17.3.  Platelets were normal.  Acetaminophen was less than 10, salicylate less than 7.  Blood alcohol was less than 10, drug screen was negative.  He had a TSH done on 9/19 that was normal at 1.506.  His EKG showed a sinus rhythm with a sinus arrhythmia and a normal QTc interval.  His vital signs are stable, he is currently afebrile.  Observation Level/Precautions:  15 minute checks  Laboratory:  Chemistry Profile  Psychotherapy:    Medications:    Consultations:    Discharge Concerns:    Estimated LOS:  Other:     Physician Treatment Plan for Primary Diagnosis: Bipolar affective disorder, manic, severe, with psychotic behavior (HCC) Long Term Goal(s): Improvement in symptoms so as ready for discharge  Short Term Goals: Ability to identify changes in lifestyle to reduce recurrence of condition will improve, Ability to verbalize feelings will improve, Ability to demonstrate self-control will improve, Ability to identify and develop effective coping behaviors will improve and Ability to maintain clinical measurements within normal limits will improve  Physician Treatment Plan for Secondary Diagnosis: Principal Problem:   Bipolar affective disorder, manic, severe, with psychotic behavior (HCC)  Long Term Goal(s): Improvement in symptoms so as ready for discharge  Short Term Goals: Ability to identify changes in lifestyle to reduce recurrence of condition will improve, Ability to verbalize feelings will improve, Ability to demonstrate self-control will improve, Ability to identify and develop effective coping behaviors will improve and Ability to maintain clinical measurements within normal limits will improve  I certify that inpatient services furnished can reasonably be expected to improve the patient's condition.    Antonieta PertGreg Lawson Kyelle Urbas, MD 11/20/20213:56 PM

## 2020-10-07 DIAGNOSIS — F312 Bipolar disorder, current episode manic severe with psychotic features: Secondary | ICD-10-CM | POA: Diagnosis not present

## 2020-10-07 MED ORDER — OLANZAPINE 2.5 MG PO TABS
2.5000 mg | ORAL_TABLET | Freq: Every day | ORAL | Status: DC
  Administered 2020-10-07: 2.5 mg via ORAL
  Filled 2020-10-07 (×2): qty 1

## 2020-10-07 MED ORDER — DIVALPROEX SODIUM 250 MG PO DR TAB
250.0000 mg | DELAYED_RELEASE_TABLET | Freq: Every evening | ORAL | Status: DC
  Administered 2020-10-07 – 2020-10-08 (×2): 250 mg via ORAL
  Filled 2020-10-07 (×4): qty 1

## 2020-10-07 NOTE — BHH Group Notes (Signed)
[facility] Group Notes: (Clinical Social Work)   10/07/2020      Type of Therapy:  Group Therapy   Participation Level:  Did Not Attend - was invited both individually by MHT and by overhead announcement, chose not to attend.   Elara Cocke Grossman-Orr, LCSW 10/07/2020, 12:03 PM    

## 2020-10-07 NOTE — Sedation Documentation (Signed)
Patient stated last night he received seroquel 50 mg while sleeping was not aware that he was dreaming which was unusual for him.  When he woke up he was unable to move his body except for his eyelids about 10 minutes. For the next 5 minutes was only able to flop around.  Once he was able to get up for the next 45 minutes to an hr he felt extremely lethargic and his motor skills were impaired.  Was fumbling with his fingers, unable to pick things up easily.  Feel very dull and numb and that he is not thinking when spoken to or asked a question.  Feel disconnected from his emotions.  Chest feels tight and very heavy.  Turning his head from side to side is very difficult.  That's all.

## 2020-10-07 NOTE — BHH Group Notes (Signed)
Adult Psychoeducational Group Not Date:  10/07/2020 Time:  0900-1045 Group Topic/Focus: PROGRESSIVE RELAXATION. A group where deep breathing is taught and tensing and relaxation muscle groups is used. Imagery is used as well.  Pts are asked to imagine 3 pillars that hold them up when they are not able to hold themselves up.  Participation Level:  Did not attend   Rashana Andrew A 10/07/2020`  

## 2020-10-07 NOTE — B[facility] Group Notes (Signed)
The focus of this group is to help patients establish daily goals to achieve during treatment and discuss how the patient can incorporate goal setting into their daily lives to aide in recovery.   Patient attended group and contributed to group. 

## 2020-10-07 NOTE — Progress Notes (Signed)
Pt observed using the phone at the shift exchange, pt stated that it was a legal phone call, stated he had a court date on Monday and needed to make that call before monday. Pt took his night meds with no issues and went to bed after that, pt offered support and encouragement as needed, will continue to monitor.

## 2020-10-07 NOTE — Progress Notes (Addendum)
D:  Patient's self inventory sheet, patient has poor sleep, no sleep medicine.  Good appetite, low energy level, poor concentration.  Denied depression and hopeless, rated anxiety 2.  Denied drugs.  Denied SI.  Physical problems, pain, chest pain, labored breathing, slowed reaction and movements.  Physical pain chest.  Plans to discuss side effects of taking trileptal and seroquel with MD, possibly try new medicine.  Concerned about medicines.  No discharge medicines. A:  Medications administered per MD orders.  Emotional support and encouragement given patient. R:  Denied SI and HI, contracts for safety.  Denied A/V hallucinations.  Safety maintained with 15 minute checks.

## 2020-10-07 NOTE — Progress Notes (Signed)
Patient stated one side of his face felt itchy and his hands are shaking.  A few minutes later he said both hands are shaking.  Both sides of his face feel numb and behind his ears feel numb.  Patient refused vistaril.  NP informed and will talk with patient.

## 2020-10-07 NOTE — Plan of Care (Signed)
Nurse discussed anxiety, depression and coping skills with patient.  

## 2020-10-07 NOTE — Progress Notes (Signed)
[facility] MD Progress Note  10/07/2020 1:14 PM NARESH ALTHAUS  MRN:  [id number] Subjective: Patient is a 24 year old male with a past psychiatric history significant for bipolar disorder who was admitted on 10/06/2020 secondary to homicidal ideation, manic symptomatology.  Objective: Patient is seen and examined.  Patient is a 24 year old male with the above-stated past psychiatric history who is seen in follow-up.  This morning he was very concerned that he was having "an allergic reaction" to the Trileptal and/or the Seroquel.  His Trileptal dosage was only 150 mg p.o. twice daily, and his Seroquel dosage was 50 mg.  During the episode there was no evidence of rash, wheezing or other systemic issue.  His vital signs were completely stable.  We discussed it today, and I told him that I felt as though it was highly unlikely given the lowness of the dosages that that was what was taking place.  We discussed the fact that he has other somatic complaints from his previous tetanus shot, and also some concerns about Covid and Covid vaccination.  His speech rate is fine today, but he is still very anxious.  We discussed several different medications that he would have the options of being treated with, and he is agreed to Depakote DR and Zyprexa.  I assured him that I was going to stop the Trileptal and the Seroquel.  He denied any suicidal or homicidal ideation.  No auditory or visual hallucinations.  As stated above his vital signs are stable, he is afebrile.  No new laboratories.  Principal Problem: Bipolar affective disorder, manic, severe, with psychotic behavior (HCC) Diagnosis: Principal Problem:   Bipolar affective disorder, manic, severe, with psychotic behavior (HCC)  Total Time spent with patient: 20 minutes  Past Psychiatric History: See admission H&P  Past Medical History:  Past Medical History:  Diagnosis Date  . Bipolar disorder (HCC)   . Pneumonia     Past Surgical History:  Procedure  Laterality Date  . WISDOM TOOTH EXTRACTION     Family History:  Family History  Problem Relation Age of Onset  . Healthy Mother   . Healthy Father   . Hypertension Other    Family Psychiatric  History: See admission H&P Social History:  Social History   Substance and Sexual Activity  Alcohol Use No     Social History   Substance and Sexual Activity  Drug Use No    Social History   Socioeconomic History  . Marital status: Single    Spouse name: Not on file  . Number of children: Not on file  . Years of education: Not on file  . Highest education level: Not on file  Occupational History  . Not on file  Tobacco Use  . Smoking status: Never Smoker  . Smokeless tobacco: Never Used  Vaping Use  . Vaping Use: Never used  Substance and Sexual Activity  . Alcohol use: No  . Drug use: No  . Sexual activity: Never  Other Topics Concern  . Not on file  Social History Narrative  . Not on file   Social Determinants of Health   Financial Resource Strain:   . Difficulty of Paying Living Expenses: Not on file  Food Insecurity:   . Worried About Programme researcher, broadcasting/film/video in the Last Year: Not on file  . Ran Out of Food in the Last Year: Not on file  Transportation Needs:   . Lack of Transportation (Medical): Not on file  . Lack of Transportation (  Non-Medical): Not on file  Physical Activity:   . Days of Exercise per Week: Not on file  . Minutes of Exercise per Session: Not on file  Stress:   . Feeling of Stress : Not on file  Social Connections:   . Frequency of Communication with Friends and Family: Not on file  . Frequency of Social Gatherings with Friends and Family: Not on file  . Attends Religious Services: Not on file  . Active Member of Clubs or Organizations: Not on file  . Attends Banker Meetings: Not on file  . Marital Status: Not on file   Additional Social History:                         Sleep: Good  Appetite:  Fair  Current  Medications: Current Facility-Administered Medications  Medication Dose Route Frequency Provider Last Rate Last Admin  . acetaminophen (TYLENOL) tablet 650 mg  650 mg Oral Q6H PRN Antonieta Pert, MD      . alum & mag hydroxide-simeth (MAALOX/MYLANTA) 200-200-20 MG/5ML suspension 30 mL  30 mL Oral Q4H PRN Antonieta Pert, MD      . hydrOXYzine (ATARAX/VISTARIL) tablet 25 mg  25 mg Oral TID PRN Antonieta Pert, MD      . OLANZapine zydis (ZYPREXA) disintegrating tablet 5 mg  5 mg Oral Q8H PRN Antonieta Pert, MD       And  . LORazepam (ATIVAN) tablet 1 mg  1 mg Oral Q4H PRN Antonieta Pert, MD       And  . ziprasidone (GEODON) injection 20 mg  20 mg Intramuscular Q6H PRN Antonieta Pert, MD      . magnesium hydroxide (MILK OF MAGNESIA) suspension 30 mL  30 mL Oral Daily PRN Antonieta Pert, MD      . Oxcarbazepine (TRILEPTAL) tablet 300 mg  300 mg Oral BID Charm Rings, NP   300 mg at 10/07/20 1937  . QUEtiapine (SEROQUEL) tablet 50 mg  50 mg Oral QHS Charm Rings, NP   50 mg at 10/06/20 2036  . traZODone (DESYREL) tablet 50 mg  50 mg Oral QHS PRN Antonieta Pert, MD        Lab Results: No results found for this or any previous visit (from the past 48 hour(s)).  Blood Alcohol level:  Lab Results  Component Value Date   ETH <10 10/03/2020   ETH <10 08/03/2020    Metabolic Disorder Labs: Lab Results  Component Value Date   HGBA1C 5.0 08/05/2020   MPG 96.8 08/05/2020   No results found for: PROLACTIN Lab Results  Component Value Date   CHOL 176 08/05/2020   TRIG 58 08/05/2020   HDL 59 08/05/2020   CHOLHDL 3.0 08/05/2020   VLDL 12 08/05/2020   LDLCALC 105 (H) 08/05/2020    Physical Findings: AIMS: Facial and Oral Movements Muscles of Facial Expression: None, normal Lips and Perioral Area: None, normal Jaw: None, normal Tongue: None, normal,Extremity Movements Upper (arms, wrists, hands, fingers): None, normal Lower (legs, knees, ankles,  toes): None, normal, Trunk Movements Neck, shoulders, hips: None, normal, Overall Severity Severity of abnormal movements (highest score from questions above): None, normal Incapacitation due to abnormal movements: None, normal Patient's awareness of abnormal movements (rate only patient's report): No Awareness, Dental Status Current problems with teeth and/or dentures?: No Does patient usually wear dentures?: No  CIWA:    COWS:  Musculoskeletal: Strength & Muscle Tone: within normal limits Gait & Station: normal Patient leans: N/A  Psychiatric Specialty Exam: Physical Exam Vitals and nursing note reviewed.  Constitutional:      Appearance: Normal appearance.  HENT:     Head: Normocephalic and atraumatic.  Pulmonary:     Effort: Pulmonary effort is normal.  Neurological:     General: No focal deficit present.     Mental Status: He is alert and oriented to person, place, and time.     Review of Systems  Blood pressure 138/85, pulse 91, temperature 97.8 F (36.6 C), temperature source Oral, resp. rate 18, height 5\' 7"  (1.702 m), weight 68.9 kg, SpO2 100 %.Body mass index is 23.81 kg/m.  General Appearance: Casual  Eye Contact:  Fair  Speech:  Normal Rate  Volume:  Normal  Mood:  Anxious  Affect:  Congruent  Thought Process:  Coherent and Descriptions of Associations: Intact  Orientation:  Full (Time, Place, and Person)  Thought Content:  Rumination  Suicidal Thoughts:  No  Homicidal Thoughts:  No  Memory:  Immediate;   Good Recent;   Good Remote;   Good  Judgement:  Intact  Insight:  Lacking  Psychomotor Activity:  Increased  Concentration:  Concentration: Fair and Attention Span: Fair  Recall:  of Knowledge:  Fair  Language:  Good  Akathisia:  Negative  Handed:  Right  AIMS (if indicated):     Assets:  Desire for Improvement Housing Resilience Social Support  ADL's:  Intact  Cognition:  WNL  Sleep:  Number of Hours: 6.75     Treatment  Plan Summary: Daily contact with patient to assess and evaluate symptoms and progress in treatment, Medication management and Plan : Patient is seen and examined.  Patient is a 24 year old male with the above-stated past psychiatric history who is seen in follow-up.   Diagnosis: 1.  Bipolar disorder, most recently manic, severe  Pertinent findings on examination today: 1.  Significantly anxious, especially about his medications. 2.  Somatic reaction to medications that I think is more anxiety based. 3.  Given his intolerance of medications we discussed switching them. 4.  Sleep was good last night. 5.  Speech rate is decreased.  Plan: 1.  Stop Trileptal. 2.  Stop Seroquel. 3.  Start Depakote DR 250 mg p.o. every afternoon for mood dysregulation. 4.  Start Zyprexa 2.5 mg p.o. nightly and titrate for mood dysregulation and sedation. 5.  Continue hydroxyzine 25 mg p.o. 3 times daily as needed anxiety. 6.  Continue trazodone 50 mg p.o. nightly as needed insomnia. 7.  Disposition planning-in progress.  30, MD 10/07/2020, 1:14 PM

## 2020-10-08 DIAGNOSIS — F312 Bipolar disorder, current episode manic severe with psychotic features: Secondary | ICD-10-CM | POA: Diagnosis not present

## 2020-10-08 MED ORDER — OLANZAPINE 5 MG PO TABS
5.0000 mg | ORAL_TABLET | Freq: Every day | ORAL | Status: DC
  Administered 2020-10-08 – 2020-10-09 (×2): 5 mg via ORAL
  Filled 2020-10-08 (×4): qty 1

## 2020-10-08 NOTE — Progress Notes (Addendum)
D:  Patient's self inventory sheet, patient has fair sleep.  No sleep medications given.  Good appetite, normal energy level, good concentration.  Denied depression and hopeless, rated anxiety 1.  Denied withdrawals.  Denied SI.  Denied physical problems.  Denied physical pain.  Will discuss meds with MD and discharge.  Has a legal hearing today at 9:00 that he must attend.  Will discuss future options with MD. A:  Medications administered per MD order.  Emotional support and encouragement given patient. R:  Denied SI and HI, contracts for safety.  Denied A/V Hallucinations.  Safety maintained with 15 minute checks.

## 2020-10-08 NOTE — [facility] Group Notes (Signed)
[facility] LCSW Group Therapy  10/08/2020 2:24 PM  Type of Therapy:  Why Try Therapy   Participation Level:  Did Not Attend  Participation Quality:  Did not attend  Affect:  Did not attend  Cognitive:  Did not attend  Insight:  Did not attend   Engagement in Therapy:  Did not attend  Modes of Intervention:  Activity and Discussion  Summary of Progress/Problems: Pt did not attend the group  Aram Beecham 10/08/2020, 2:24 PM

## 2020-10-08 NOTE — Progress Notes (Signed)
Adult Psychoeducational Group Note  Date:  10/08/2020 Time:  6:29 PM  Group Topic/Focus:  Developing a Wellness Toolbox:   The focus of this group is to help patients develop a "wellness toolbox" with skills and strategies to promote recovery upon discharge.  Participation Level:  Active  Participation Quality:  Appropriate  Affect:  Appropriate  Cognitive:  Appropriate  Insight: Appropriate  Engagement in Group:  Engaged  Modes of Intervention:  Discussion and Education  Additional Comments:  Pt was able to attend group this afternoon and shared positively with the group.  Edmond Ginsberg E 10/08/2020, 6:29 PM

## 2020-10-08 NOTE — Plan of Care (Signed)
Nuarse discussed anxiety, depression and coping skills with patient. ° °

## 2020-10-08 NOTE — Progress Notes (Signed)
   10/08/20 2104  Psych Admission Type (Psych Patients Only)  Admission Status Involuntary  Psychosocial Assessment  Patient Complaints None  Eye Contact Fair  Facial Expression Animated  Affect Flat  Speech Pressured  Interaction Assertive  Motor Activity Other (Comment) (wnl)  Appearance/Hygiene Unremarkable  Behavior Characteristics Cooperative  Mood Pleasant  Thought Process  Coherency Tangential  Content WDL  Delusions None reported or observed  Perception WDL  Hallucination None reported or observed  Judgment Impaired  Confusion None  Danger to Self  Current suicidal ideation? Denies  Danger to Others  Danger to Others None reported or observed   Pt seen in dayroom not interacting. Pt denies SI, HI, AVH and pain.

## 2020-10-08 NOTE — Progress Notes (Addendum)
Recreation Therapy Notes  Date:  1.22.20 Time: 0930 Location: 300 Hall Dayroom  Group Topic: Stress Management  Goal Area(s) Addresses:  Patient will identify positive stress management techniques. Patient will identify benefits of using stress management post d/c.  Intervention: Stress Management  Activity :  Meditation.  LRT played a meditation that guided patients through a body scan to take inventory of any sensations they may have been feeling at the time.  Patients were to listen and follow along as meditation played to fully engage.  Education:  Stress Management, Discharge Planning.   Education Outcome: Acknowledges Education  Clinical Observations/Feedback: Pt did not attend group session.      Caroll Rancher, LRT/CTRS         Caroll Rancher A 10/08/2020 12:21 PM

## 2020-10-08 NOTE — Tx Team (Signed)
Interdisciplinary Treatment and Diagnostic Plan Update  10/08/2020 Time of Session: 10:00am  Keith Guerrero MRN: [id number]  Principal Diagnosis: Bipolar affective disorder, manic, severe, with psychotic behavior (HCC)  Secondary Diagnoses: Principal Problem:   Bipolar affective disorder, manic, severe, with psychotic behavior (HCC)   Current Medications:  Current Facility-Administered Medications  Medication Dose Route Frequency Provider Last Rate Last Admin  . acetaminophen (TYLENOL) tablet 650 mg  650 mg Oral Q6H PRN Antonieta Pert, MD      . alum & mag hydroxide-simeth (MAALOX/MYLANTA) 200-200-20 MG/5ML suspension 30 mL  30 mL Oral Q4H PRN Antonieta Pert, MD      . divalproex (DEPAKOTE) DR tablet 250 mg  250 mg Oral QPM Antonieta Pert, MD   250 mg at 10/07/20 1809  . hydrOXYzine (ATARAX/VISTARIL) tablet 25 mg  25 mg Oral TID PRN Antonieta Pert, MD      . OLANZapine zydis (ZYPREXA) disintegrating tablet 5 mg  5 mg Oral Q8H PRN Antonieta Pert, MD       And  . LORazepam (ATIVAN) tablet 1 mg  1 mg Oral Q4H PRN Antonieta Pert, MD       And  . ziprasidone (GEODON) injection 20 mg  20 mg Intramuscular Q6H PRN Antonieta Pert, MD      . magnesium hydroxide (MILK OF MAGNESIA) suspension 30 mL  30 mL Oral Daily PRN Antonieta Pert, MD      . OLANZapine [facility]) tablet 2.5 mg  2.5 mg Oral QHS Antonieta Pert, MD   2.5 mg at 10/07/20 2118  . traZODone (DESYREL) tablet 50 mg  50 mg Oral QHS PRN Antonieta Pert, MD       PTA Medications: Medications Prior to Admission  Medication Sig Dispense Refill Last Dose  . OXcarbazepine (TRILEPTAL) 150 MG tablet Take 0.5 tablets (75 mg total) by mouth 2 (two) times daily. (Patient not taking: Reported on 10/03/2020) 60 tablet 0     Patient Stressors: Marital or family conflict Medication change or noncompliance Other: Chronic mental illness  Patient Strengths: Metallurgist fund  of knowledge Physical Health Supportive family/friends  Treatment Modalities: Medication Management, Group therapy, Case management,  1 to 1 session with clinician, Psychoeducation, Recreational therapy.   Physician Treatment Plan for Primary Diagnosis: Bipolar affective disorder, manic, severe, with psychotic behavior (HCC) Long Term Goal(s): Improvement in symptoms so as ready for discharge Improvement in symptoms so as ready for discharge   Short Term Goals: Ability to identify changes in lifestyle to reduce recurrence of condition will improve Ability to verbalize feelings will improve Ability to demonstrate self-control will improve Ability to identify and develop effective coping behaviors will improve Ability to maintain clinical measurements within normal limits will improve Compliance with prescribed medications will improve Ability to identify triggers associated with substance abuse/mental health issues will improve Ability to identify changes in lifestyle to reduce recurrence of condition will improve Ability to verbalize feelings will improve Ability to demonstrate self-control will improve Ability to identify and develop effective coping behaviors will improve Ability to maintain clinical measurements within normal limits will improve Compliance with prescribed medications will improve  Medication Management: Evaluate patient's response, side effects, and tolerance of medication regimen.  Therapeutic Interventions: 1 to 1 sessions, Unit Group sessions and Medication administration.  Evaluation of Outcomes: Progressing  Physician Treatment Plan for Secondary Diagnosis: Principal Problem:   Bipolar affective disorder, manic, severe, with psychotic behavior (HCC)  Long Term  Goal(s): Improvement in symptoms so as ready for discharge Improvement in symptoms so as ready for discharge   Short Term Goals: Ability to identify changes in lifestyle to reduce recurrence of  condition will improve Ability to verbalize feelings will improve Ability to demonstrate self-control will improve Ability to identify and develop effective coping behaviors will improve Ability to maintain clinical measurements within normal limits will improve Compliance with prescribed medications will improve Ability to identify triggers associated with substance abuse/mental health issues will improve Ability to identify changes in lifestyle to reduce recurrence of condition will improve Ability to verbalize feelings will improve Ability to demonstrate self-control will improve Ability to identify and develop effective coping behaviors will improve Ability to maintain clinical measurements within normal limits will improve Compliance with prescribed medications will improve     Medication Management: Evaluate patient's response, side effects, and tolerance of medication regimen.  Therapeutic Interventions: 1 to 1 sessions, Unit Group sessions and Medication administration.  Evaluation of Outcomes: Progressing   RN Treatment Plan for Primary Diagnosis: Bipolar affective disorder, manic, severe, with psychotic behavior (HCC) Long Term Goal(s): Knowledge of disease and therapeutic regimen to maintain health will improve  Short Term Goals: Ability to remain free from injury will improve, Ability to demonstrate self-control, Ability to participate in decision making will improve, Ability to verbalize feelings will improve, Ability to disclose and discuss suicidal ideas, Ability to identify and develop effective coping behaviors will improve and Compliance with prescribed medications will improve  Medication Management: RN will administer medications as ordered by provider, will assess and evaluate patient's response and provide education to patient for prescribed medication. RN will report any adverse and/or side effects to prescribing provider.  Therapeutic Interventions: 1 on 1  counseling sessions, Psychoeducation, Medication administration, Evaluate responses to treatment, Monitor vital signs and CBGs as ordered, Perform/monitor CIWA, COWS, AIMS and Fall Risk screenings as ordered, Perform wound care treatments as ordered.  Evaluation of Outcomes: Progressing   LCSW Treatment Plan for Primary Diagnosis: Bipolar affective disorder, manic, severe, with psychotic behavior (HCC) Long Term Goal(s): Safe transition to appropriate next level of care at discharge, Engage patient in therapeutic group addressing interpersonal concerns.  Short Term Goals: Engage patient in aftercare planning with referrals and resources, Increase social support, Increase ability to appropriately verbalize feelings, Increase emotional regulation, Facilitate acceptance of mental health diagnosis and concerns, Facilitate patient progression through stages of change regarding substance use diagnoses and concerns and Identify triggers associated with mental health/substance abuse issues  Therapeutic Interventions: Assess for all discharge needs, 1 to 1 time with Social worker, Explore available resources and support systems, Assess for adequacy in community support network, Educate family and significant other(s) on suicide prevention, Complete Psychosocial Assessment, Interpersonal group therapy.  Evaluation of Outcomes: Progressing   Progress in Treatment: Attending groups: No. Participating in groups: No. Taking medication as prescribed: Yes. Toleration medication: Yes. Family/Significant other contact made: No, will contact:  If consents are given  Patient understands diagnosis: Yes. Discussing patient identified problems/goals with staff: Yes. Medical problems stabilized or resolved: Yes. Denies suicidal/homicidal ideation: Yes. Issues/concerns per patient self-inventory: No.   New problem(s) identified: No, Describe:  None  New Short Term/Long Term Goal(s): medication stabilization,  elimination of SI thoughts, development of comprehensive mental wellness plan.   Patient Goals:  Did not attend  Discharge Plan or Barriers: Patient recently admitted. CSW will continue to follow and assess for appropriate referrals and possible discharge planning.   Reason for Continuation of Hospitalization:  Delusions  Medication stabilization Withdrawal symptoms  Estimated Length of Stay: 3 to 5 days   Attendees: Patient: Did not attend  10/08/2020   Physician: Pricilla Larsson, MD 10/08/2020   Nursing:  10/08/2020   RN Care Manager: 10/08/2020   Social Worker: Melba Coon, LCSWA 10/08/2020   Recreational Therapist:  10/08/2020   Other:  10/08/2020   Other:  10/08/2020   Other: 10/08/2020     Scribe for Treatment Team: Aram Beecham, LCSWA 10/08/2020 11:37 AM

## 2020-10-08 NOTE — Progress Notes (Signed)
[facility] MD Progress Note  10/08/2020 1:12 PM DIA JEFFERYS  MRN:  [id number] Subjective: Patient is a 24 year old male with a past psychiatric history significant for bipolar disorder who was admitted on 10/06/2020 secondary to homicidal ideation, manic symptomatology.  Objective: Patient is seen and examined.  Patient is a 24 year old male with the above-stated past psychiatric history who is seen in follow-up.  Patient interviewed this afternoon. Pt tangential although easily redirected. He provides information as per chart of what led to hospitalization. Apart from recounting events leading up to admission, TC is mostly delusional in nature, TP disorganized with some FOI. Upon introduction, patient asks if my last name is german and goes on a tangent about the meaning of serpants and discusses how the nazis and the [place] intelligence agency have had a relationship since post WW2 and indicates that vaccines aren't harmless. He goes on to discuss how he believes that a recent tetanus vaccine he got may have some technology that had "nervetagged" him. He states that he has personal knowledge about an ingredient being used in the covid vaccine specifically, called graphene-oxide, and it isn't unreasonable that it is being in other vaccines as well.  He states that this substance  is occasionally used as adjuvents in vaccines it is also used by [employer] for "brain control" and discusses "wireless interfaces" and how he knows individuals who work at [employer] who can corroborate this information. He states that ~9 months before covid broke out in [place], his professor mentioned how a "double dose vaccine" will become important. He states that this professor is connected in the intelligence community and he does not believe that this was a coincidence. 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 Patient goes on  multiple tangents, +FOI ,  Discussing how things are connected, mentions "free masons" ,  " the ides of march", "egyptology". He volunteers that he checked the room for recording devices which is why he feels comfortably disclosing this information. Discussed increasing zyprexa, agreeable although somewhat hesitant. Denies SE as reported with seroquel and trileptal. Denies physical complaints today. Denies SI/HI.    Principal Problem: Bipolar affective disorder, manic, severe, with psychotic behavior (HCC) Diagnosis: Principal Problem:   Bipolar affective disorder, manic, severe, with psychotic behavior (HCC)  Total Time spent with patient: 20 minutes  Past Psychiatric History: See admission H&P  Past Medical History:  Past Medical History:  Diagnosis Date  . Bipolar disorder (HCC)   . Pneumonia     Past Surgical History:  Procedure Laterality Date  . WISDOM TOOTH EXTRACTION     Family History:  Family History  Problem Relation Age of Onset  . Healthy Mother   . Healthy Father   . Hypertension Other    Family Psychiatric  History: See admission H&P Social History:  Social History   Substance and Sexual Activity  Alcohol Use No     Social History   Substance and Sexual Activity  Drug Use No    Social History   Socioeconomic History  . Marital status: Single    Spouse name: Not on file  . Number of children: Not on file  . Years of education: Not on file  . Highest education level: Not on file  Occupational History  . Not on file  Tobacco Use  . Smoking status: Never Smoker  . Smokeless tobacco: Never Used  Vaping Use  . Vaping Use:  Never used  Substance and Sexual Activity  . Alcohol use: No  . Drug use: No  . Sexual activity: Never  Other Topics Concern  . Not on file  Social History Narrative  . Not on file   Social Determinants of Health   Financial Resource Strain:   . Difficulty of Paying Living Expenses: Not on file  Food Insecurity:   .  Worried About Programme researcher, broadcasting/film/video in the Last Year: Not on file  . Ran Out of Food in the Last Year: Not on file  Transportation Needs:   . Lack of Transportation (Medical): Not on file  . Lack of Transportation (Non-Medical): Not on file  Physical Activity:   . Days of Exercise per Week: Not on file  . Minutes of Exercise per Session: Not on file  Stress:   . Feeling of Stress : Not on file  Social Connections:   . Frequency of Communication with Friends and Family: Not on file  . Frequency of Social Gatherings with Friends and Family: Not on file  . Attends Religious Services: Not on file  . Active Member of Clubs or Organizations: Not on file  . Attends Banker Meetings: Not on file  . Marital Status: Not on file   Additional Social History:                         Sleep: Good  Appetite:  Fair  Current Medications: Current Facility-Administered Medications  Medication Dose Route Frequency Provider Last Rate Last Admin  . acetaminophen (TYLENOL) tablet 650 mg  650 mg Oral Q6H PRN Antonieta Pert, MD      . alum & mag hydroxide-simeth (MAALOX/MYLANTA) 200-200-20 MG/5ML suspension 30 mL  30 mL Oral Q4H PRN Antonieta Pert, MD      . divalproex (DEPAKOTE) DR tablet 250 mg  250 mg Oral QPM Antonieta Pert, MD   250 mg at 10/07/20 1809  . hydrOXYzine (ATARAX/VISTARIL) tablet 25 mg  25 mg Oral TID PRN Antonieta Pert, MD      . OLANZapine zydis (ZYPREXA) disintegrating tablet 5 mg  5 mg Oral Q8H PRN Antonieta Pert, MD       And  . LORazepam (ATIVAN) tablet 1 mg  1 mg Oral Q4H PRN Antonieta Pert, MD       And  . ziprasidone (GEODON) injection 20 mg  20 mg Intramuscular Q6H PRN Antonieta Pert, MD      . magnesium hydroxide (MILK OF MAGNESIA) suspension 30 mL  30 mL Oral Daily PRN Antonieta Pert, MD      . OLANZapine [facility]) tablet 5 mg  5 mg Oral QHS Estella Husk, MD      . traZODone (DESYREL) tablet 50 mg  50 mg Oral QHS  PRN Antonieta Pert, MD        Lab Results: No results found for this or any previous visit (from the past 48 hour(s)).  Blood Alcohol level:  Lab Results  Component Value Date   ETH <10 10/03/2020   ETH <10 08/03/2020    Metabolic Disorder Labs: Lab Results  Component Value Date   HGBA1C 5.0 08/05/2020   MPG 96.8 08/05/2020   No results found for: PROLACTIN Lab Results  Component Value Date   CHOL 176 08/05/2020   TRIG 58 08/05/2020   HDL 59 08/05/2020   CHOLHDL 3.0 08/05/2020   VLDL 12 08/05/2020  LDLCALC 105 (H) 08/05/2020    Physical Findings: AIMS: Facial and Oral Movements Muscles of Facial Expression: None, normal Lips and Perioral Area: None, normal Jaw: None, normal Tongue: None, normal,Extremity Movements Upper (arms, wrists, hands, fingers): None, normal Lower (legs, knees, ankles, toes): None, normal, Trunk Movements Neck, shoulders, hips: None, normal, Overall Severity Severity of abnormal movements (highest score from questions above): None, normal Incapacitation due to abnormal movements: None, normal Patient's awareness of abnormal movements (rate only patient's report): No Awareness, Dental Status Current problems with teeth and/or dentures?: No Does patient usually wear dentures?: No  CIWA:    COWS:     Musculoskeletal: Strength & Muscle Tone: within normal limits Gait & Station: normal Patient leans: N/A  Psychiatric Specialty Exam: Physical Exam Vitals and nursing note reviewed.  Constitutional:      Appearance: Normal appearance.  HENT:     Head: Normocephalic and atraumatic.  Pulmonary:     Effort: Pulmonary effort is normal.  Neurological:     General: No focal deficit present.     Mental Status: He is alert and oriented to person, place, and time.     Review of Systems   Blood pressure 126/79, pulse 90, temperature 98.1 F (36.7 C), temperature source Oral, resp. rate 18, height 5\' 7"  (1.702 m), weight 68.9 kg, SpO2 100  %.Body mass index is 23.81 kg/m.  General Appearance: Casual  Eye Contact:  Fair  Speech:  Normal Rate and rapid at times  Volume:  Normal  Mood:  Anxious  Affect:  Congruent  Thought Process:  Disorganized  Orientation:  Full (Time, Place, and Person)  Thought Content:  Paranoid Ideation and Rumination  Suicidal Thoughts:  No  Homicidal Thoughts:  No  Memory:  Immediate;   Good Recent;   Good Remote;   Good  Judgement:  Impaired  Insight:  Lacking  Psychomotor Activity:  Normal  Concentration:  Concentration: Fair and Attention Span: Fair  Recall:  of Knowledge:  Fair  Language:  Good  Akathisia:  Negative  Handed:  Right  AIMS (if indicated):     Assets:  Desire for Improvement Housing Resilience Social Support  ADL's:  Intact  Cognition:  WNL  Sleep:  Number of Hours: 6     Treatment Plan Summary: Daily contact with patient to assess and evaluate symptoms and progress in treatment, Medication management and Plan : Patient is seen and examined.  Patient is a 24 year old male with the above-stated past psychiatric history who is seen in follow-up.   Diagnosis: 1.  Bipolar disorder, most recently manic, severe  Pertinent findings on examination today: 1.  Significantly anxious, especially about his medications. 2.  Somatic reaction to medications that I think is more anxiety based- denies issues with depkote and zyprexa 3.  Patient delusional and paranoid, no SI/HI   Plan: 1.  Continue Depakote DR 250 mg p.o. every afternoon for mood dysregulation. 2.  Increase  Zyprexa  To 5 mg qhs  and titrate for mood dysregulation and sedation. 3.  Continue hydroxyzine 25 mg p.o. 3 times daily as needed anxiety. 4.  Continue trazodone 50 mg p.o. nightly as needed insomnia. 5.  Disposition planning-in progress.- to be discharged to law enforcement  30, MD 10/08/2020, 1:12 PM

## 2020-10-08 NOTE — B[facility] Counselor (Signed)
CSW assisted this patient in attending virtual court hearing for interim guardianship.  Per court hearing this patients parents, Lanora Manis and Malvern Kadlec have been appointed this patients interim guardian.    Ruthann Cancer MSW, LCSW Clincal Social Worker  [facility]

## 2020-10-08 NOTE — Progress Notes (Signed)
   10/08/20 0031  Psych Admission Type (Psych Patients Only)  Admission Status Involuntary  Psychosocial Assessment  Patient Complaints Anxiety  Eye Contact Fair  Facial Expression Animated  Affect Flat  Speech Pressured  Interaction Assertive  Motor Activity Fidgety  Appearance/Hygiene Unremarkable  Behavior Characteristics Cooperative  Mood Pleasant  Thought Process  Coherency Tangential  Content WDL  Delusions None reported or observed  Perception WDL  Hallucination None reported or observed  Judgment Impaired  Confusion None  Danger to Self  Current suicidal ideation? Denies  Danger to Others  Danger to Others None reported or observed

## 2020-10-09 MED ORDER — DIVALPROEX SODIUM 500 MG PO DR TAB
750.0000 mg | DELAYED_RELEASE_TABLET | Freq: Every evening | ORAL | Status: DC
  Administered 2020-10-09 – 2020-10-12 (×4): 750 mg via ORAL
  Filled 2020-10-09 (×7): qty 1

## 2020-10-09 NOTE — Progress Notes (Signed)
Patient denies SI, HI and AVH this shift.  Patient has attended groups, been compliant with medications and has had no incidents of behavioral dyscontrol on the unit.   Assess patient for safety, offer medications as prescribed, engage patient in 1:1 staff talks.  Patient able to contract for safety.  Continue to monitor as planned.  

## 2020-10-09 NOTE — [facility] Suicide Risk Assessment (Signed)
[facility] INPATIENT:  Family/Significant Other Suicide Prevention Education   Suicide Prevention Education: Education Completed; Mother and Father Keith Guerrero and Keith Guerrero ([phone]), has been identified by the patient as the family member/significant other with whom the patient will be residing, and identified as the person(s) who will aid the patient in the event of a mental health crisis (suicidal ideations/suicide attempt).  With written consent from the patient, the family member/significant other has been provided the following suicide prevention education, prior to the and/or following the discharge of the patient.  The suicide prevention education provided includes the following:  Suicide risk factors  Suicide prevention and interventions  National Suicide Hotline telephone number  [facility] assessment telephone number  [facility] Emergency Assistance 911  [facility] and/or Residential Mobile Crisis Unit telephone number   Request made of family/significant other to:  Remove weapons (e.g., guns, rifles, knives), all items previously/currently identified as safety concern.    Remove drugs/medications (over-the-counter, prescriptions, illicit drugs), all items previously/currently identified as a safety concern.   The family member/significant other verbalizes understanding of the suicide prevention education information provided.  The family member/significant other agrees to remove the items of safety concern listed above.   CSW spoke with this patients mother and father who initially were interested in longer term residential treatment, however after speaking to several medical professionals are now interested in intensive outpatient services. CSW provided information around [facility] [place] programs and patients parents informed CSW of [facility] program through [facility].  This patients parents do not feel comfortable with this patient returning home until medication has  taken full effect and a safety plan has been created for this patient before discharging.    Ruthann Cancer MSW, LCSW Clincal Social Worker  [facility]

## 2020-10-09 NOTE — Progress Notes (Signed)
[facility] MD Progress Note  10/09/2020 4:03 PM Keith Guerrero  MRN:  [id number] Subjective:  Patient was seen and evaluated on the unit. He is calm, cooperative and pleasant. His thought process is disorganized. He was able to stay on topic and answer questions appropriately for a short time and then began talking about school, going abroad to study and various other things. He feels his medications are helping and that he is getting better. Will increase Depakote today.  His thought process is mostly factual about his life, school and his parents. He does not impart any of the story of how he came to be in the hospital.  He stares ahead and makes poor eye contact, rarely blinking. He is almost emotionless. He denies depression and anxiety. He denies suicidal thoughts, plan or intent. He has no homicidal thoughts and denies hallucinations.  He has remained calm and cooperative on the unit. He is attending group therapy and stated he is sleeping and eating well. Will continue to monitor for safety and stabilization.   Principal Problem: Bipolar affective disorder, manic, severe, with psychotic behavior (HCC) Diagnosis: Principal Problem:   Bipolar affective disorder, manic, severe, with psychotic behavior (HCC)  Total Time spent with patient: 35 minutes  Past Psychiatric History: See H&P  Past Medical History:  Past Medical History:  Diagnosis Date  . Bipolar disorder (HCC)   . Pneumonia     Past Surgical History:  Procedure Laterality Date  . WISDOM TOOTH EXTRACTION     Family History:  Family History  Problem Relation Age of Onset  . Healthy Mother   . Healthy Father   . Hypertension Other    Family Psychiatric  History: See H&P Social History:  Social History   Substance and Sexual Activity  Alcohol Use No     Social History   Substance and Sexual Activity  Drug Use No    Social History   Socioeconomic History  . Marital status: Single    Spouse name: Not on file  .  Number of children: Not on file  . Years of education: Not on file  . Highest education level: Not on file  Occupational History  . Not on file  Tobacco Use  . Smoking status: Never Smoker  . Smokeless tobacco: Never Used  Vaping Use  . Vaping Use: Never used  Substance and Sexual Activity  . Alcohol use: No  . Drug use: No  . Sexual activity: Never  Other Topics Concern  . Not on file  Social History Narrative  . Not on file   Social Determinants of Health   Financial Resource Strain:   . Difficulty of Paying Living Expenses: Not on file  Food Insecurity:   . Worried About Programme researcher, broadcasting/film/video in the Last Year: Not on file  . Ran Out of Food in the Last Year: Not on file  Transportation Needs:   . Lack of Transportation (Medical): Not on file  . Lack of Transportation (Non-Medical): Not on file  Physical Activity:   . Days of Exercise per Week: Not on file  . Minutes of Exercise per Session: Not on file  Stress:   . Feeling of Stress : Not on file  Social Connections:   . Frequency of Communication with Friends and Family: Not on file  . Frequency of Social Gatherings with Friends and Family: Not on file  . Attends Religious Services: Not on file  . Active Member of Clubs or Organizations: Not on  file  . Attends Banker Meetings: Not on file  . Marital Status: Not on file   Additional Social History:     Sleep: Fair  Appetite:  Good  Current Medications: Current Facility-Administered Medications  Medication Dose Route Frequency Provider Last Rate Last Admin  . acetaminophen (TYLENOL) tablet 650 mg  650 mg Oral Q6H PRN Antonieta Pert, MD      . alum & mag hydroxide-simeth (MAALOX/MYLANTA) 200-200-20 MG/5ML suspension 30 mL  30 mL Oral Q4H PRN Antonieta Pert, MD      . divalproex (DEPAKOTE) DR tablet 750 mg  750 mg Oral QPM Laveda Abbe, NP      . hydrOXYzine (ATARAX/VISTARIL) tablet 25 mg  25 mg Oral TID PRN Antonieta Pert, MD       . OLANZapine zydis (ZYPREXA) disintegrating tablet 5 mg  5 mg Oral Q8H PRN Antonieta Pert, MD       And  . LORazepam (ATIVAN) tablet 1 mg  1 mg Oral Q4H PRN Antonieta Pert, MD       And  . ziprasidone (GEODON) injection 20 mg  20 mg Intramuscular Q6H PRN Antonieta Pert, MD      . magnesium hydroxide (MILK OF MAGNESIA) suspension 30 mL  30 mL Oral Daily PRN Antonieta Pert, MD      . OLANZapine [facility]) tablet 5 mg  5 mg Oral QHS Estella Husk, MD   5 mg at 10/08/20 2114  . traZODone (DESYREL) tablet 50 mg  50 mg Oral QHS PRN Antonieta Pert, MD        Lab Results: No results found for this or any previous visit (from the past 48 hour(s)).  Blood Alcohol level:  Lab Results  Component Value Date   ETH <10 10/03/2020   ETH <10 08/03/2020    Metabolic Disorder Labs: Lab Results  Component Value Date   HGBA1C 5.0 08/05/2020   MPG 96.8 08/05/2020   No results found for: PROLACTIN Lab Results  Component Value Date   CHOL 176 08/05/2020   TRIG 58 08/05/2020   HDL 59 08/05/2020   CHOLHDL 3.0 08/05/2020   VLDL 12 08/05/2020   LDLCALC 105 (H) 08/05/2020    Physical Findings: AIMS: Facial and Oral Movements Muscles of Facial Expression: None, normal Lips and Perioral Area: None, normal Jaw: None, normal Tongue: None, normal,Extremity Movements Upper (arms, wrists, hands, fingers): None, normal Lower (legs, knees, ankles, toes): None, normal, Trunk Movements Neck, shoulders, hips: None, normal, Overall Severity Severity of abnormal movements (highest score from questions above): None, normal Incapacitation due to abnormal movements: None, normal Patient's awareness of abnormal movements (rate only patient's report): No Awareness, Dental Status Current problems with teeth and/or dentures?: No Does patient usually wear dentures?: No  CIWA:    COWS:     Musculoskeletal: Strength & Muscle Tone: within normal limits Gait & Station: normal Patient  leans: N/A  Psychiatric Specialty Exam: Physical Exam  Review of Systems  Blood pressure 133/75, pulse 81, temperature 98 F (36.7 C), temperature source Oral, resp. rate 18, height 5\' 7"  (1.702 m), weight 68.9 kg, SpO2 100 %.Body mass index is 23.81 kg/m.  General Appearance: Casual and Fairly Groomed  Eye Contact:  Fair  Speech:  Clear and Coherent and Normal Rate  Volume:  Normal  Mood:  Anxious  Affect:  Congruent  Thought Process:  Disorganized  Orientation:  Full (Time, Place, and Person)  Thought Content:  Rumination  Suicidal Thoughts:  No  Homicidal Thoughts:  No  Memory:  Immediate;   Fair Recent;   Fair Remote;   Fair  Judgement:  Impaired  Insight:  Shallow  Psychomotor Activity:  Normal  Concentration:  Concentration: Fair and Attention Span: Fair  Recall:  [employer] of Knowledge:  Fair  Language:  Good  Akathisia:  Negative  Handed:  Right  AIMS (if indicated):     Assets:  Desire for Improvement Financial Resources/Insurance Housing Physical Health Social Support Vocational/Educational  ADL's:  Intact  Cognition:  WNL  Sleep:  Number of Hours: 5.75     Treatment Plan Summary: Daily contact with patient to assess and evaluate symptoms and progress in treatment and Medication management   Plan: 1.  Increase  Depakote DR to 750 mg p.o. every afternoon for mood dysregulation. 2.  Continue  Zyprexa 5 mg qhs  and titrate for mood dysregulation and sedation. 3.  Continue hydroxyzine 25 mg p.o. 3 times daily as needed anxiety. 4.  Continue trazodone 50 mg p.o. nightly as needed insomnia. 5.  Disposition planning-in progress.- to be discharged to law enforcement    Laveda Abbe, NP 10/09/2020, 4:03 PM

## 2020-10-09 NOTE — B[facility] Counselor (Signed)
CSW spoke with this patients parents who are interested in this patient receiving treatment from [facility] or PHP post discharge.   Patients parents also stated that they do not feel comfortable with this patient returning home until the medication has taken full effect.    Ruthann Cancer MSW, LCSW Clincal Social Worker  [facility]

## 2020-10-09 NOTE — Progress Notes (Signed)
   10/09/20 1200  Psych Admission Type (Psych Patients Only)  Admission Status Involuntary  Psychosocial Assessment  Patient Complaints None  Eye Contact Fair  Facial Expression Flat  Affect Flat  Speech Pressured  Interaction Assertive  Motor Activity Other (Comment) (WDL)  Appearance/Hygiene Unremarkable  Behavior Characteristics Cooperative  Mood Pleasant  Thought Process  Coherency Concrete thinking  Content WDL  Delusions None reported or observed  Perception WDL  Hallucination None reported or observed  Judgment Impaired  Confusion None  Danger to Self  Current suicidal ideation? Denies  Danger to Others  Danger to Others None reported or observed   Pt has a flat affect and is factual and direct with speech.  He is concrete in thinking answering directly and not elaborating. Offered support and 15 minute checks. Safety maintained on the unit.

## 2020-10-10 MED ORDER — OLANZAPINE 10 MG PO TABS
10.0000 mg | ORAL_TABLET | Freq: Every day | ORAL | Status: DC
  Administered 2020-10-10 – 2020-10-11 (×2): 10 mg via ORAL
  Filled 2020-10-10 (×4): qty 1

## 2020-10-10 NOTE — Progress Notes (Signed)
Writer observed patient in the dayroom interacting with another peer. Writer spoke with him 1:1 and he reported having had a good day with no issues. Writer informed him of his scheduled medication and he asked why his Zyprexa dosage keeps increasing. Writer encouraged him to speak with the doctor about his dosage increase. He was pleasant and returned to the dayroom to interact with peers. Safety maintained on unit with 15 min checks.

## 2020-10-10 NOTE — [facility] Group Notes (Signed)
[facility] LCSW Group Therapy  10/10/2020 2:04 PM  Type of Therapy:  Cognitive Distortions and Activations   Participation Level:  Active  Participation Quality:  Appropriate and Sharing  Affect:  Appropriate  Cognitive:  Appropriate  Insight:  Developing/Improving  Engagement in Therapy:  Engaged  Modes of Intervention:  Activity and Education  Summary of Progress/Problems: Keith Guerrero states that when he leaves the hospital he wants to get back to some of the hobbies he has not participated in for a while.  Keith Guerrero states that one of these hobbies is Barrister's clerk.  Keith Guerrero states that he feels that he often minimizes and magnifies issues and gave the example of when he received a poor grade and felt like he was not doing as well as he thought in school.  Keith Guerrero also states that he enjoys hiking and that this is another activity that he plans to do when he is discharged from the hospital.  Keith Guerrero remained in group the entire time and accepted the handouts that were provided.   Keith Guerrero 10/10/2020, 2:04 PM

## 2020-10-10 NOTE — Progress Notes (Signed)
   10/10/20 1955  COVID-19 Daily Checkoff  Have you had a fever (temp > 37.80C/100F)  in the past 24 hours?  No  If you have had runny nose, nasal congestion, sneezing in the past 24 hours, has it worsened? No  COVID-19 EXPOSURE  Have you traveled outside the state in the past 14 days? No  Have you been in contact with someone with a confirmed diagnosis of COVID-19 or PUI in the past 14 days without wearing appropriate PPE? No  Have you been living in the same home as a person with confirmed diagnosis of COVID-19 or a PUI (household contact)? No  Have you been diagnosed with COVID-19? No

## 2020-10-10 NOTE — Progress Notes (Signed)
   10/09/20 2049  COVID-19 Daily Checkoff  Have you had a fever (temp > 37.80C/100F)  in the past 24 hours?  No  COVID-19 EXPOSURE  Have you traveled outside the state in the past 14 days? No  Have you been in contact with someone with a confirmed diagnosis of COVID-19 or PUI in the past 14 days without wearing appropriate PPE? No  Have you been living in the same home as a person with confirmed diagnosis of COVID-19 or a PUI (household contact)? No  Have you been diagnosed with COVID-19? No

## 2020-10-10 NOTE — Progress Notes (Signed)
Pt states his goal for the day is to meet with a psychiatrist about his medications.

## 2020-10-10 NOTE — Progress Notes (Signed)
[facility] MD Progress Note  10/10/2020 11:36 AM MICHEAL MURAD  MRN:  [id number] Subjective:  Patient was seen and evaluated on the unit. He is calm, cooperative and pleasant. His thought process is disorganized. He was able to stay on topic and answer questions appropriately for a short time and then began talking about school, going abroad to study and various other things. He feels his medications are helping and that he is getting better. Will increase Depakote today.  His thought process is mostly factual about his life, school and his parents. He is a bit more animated today but still somewhat rigid in his posture. He is able to hold a coherent, linear conversation. His parents are actively involved in seeking outpatient treatment. He is asking how much longer he has to stay and stated he does not know how much more good he can do by staying here. Explained top patient that medications are still being adjusted for effectiveness. He agrees he wants to be stable when he leaves. He denies depression and anxiety. He denies suicidal thoughts, plan or intent. He has no homicidal thoughts and denies hallucinations. He has remained calm and cooperative on the unit. His affect is flat. He is attending group therapy and stated he is sleeping and eating well. Will continue to monitor for safety and stabilization.   Principal Problem: Bipolar affective disorder, manic, severe, with psychotic behavior (HCC) Diagnosis: Principal Problem:   Bipolar affective disorder, manic, severe, with psychotic behavior (HCC)  Total Time spent with patient: 35 minutes  Past Psychiatric History: See H&P  Past Medical History:  Past Medical History:  Diagnosis Date  . Bipolar disorder (HCC)   . Pneumonia     Past Surgical History:  Procedure Laterality Date  . WISDOM TOOTH EXTRACTION     Family History:  Family History  Problem Relation Age of Onset  . Healthy Mother   . Healthy Father   . Hypertension Other     Family Psychiatric  History: See H&P Social History:  Social History   Substance and Sexual Activity  Alcohol Use No     Social History   Substance and Sexual Activity  Drug Use No    Social History   Socioeconomic History  . Marital status: Single    Spouse name: Not on file  . Number of children: Not on file  . Years of education: Not on file  . Highest education level: Not on file  Occupational History  . Not on file  Tobacco Use  . Smoking status: Never Smoker  . Smokeless tobacco: Never Used  Vaping Use  . Vaping Use: Never used  Substance and Sexual Activity  . Alcohol use: No  . Drug use: No  . Sexual activity: Never  Other Topics Concern  . Not on file  Social History Narrative  . Not on file   Social Determinants of Health   Financial Resource Strain:   . Difficulty of Paying Living Expenses: Not on file  Food Insecurity:   . Worried About Programme researcher, broadcasting/film/video in the Last Year: Not on file  . Ran Out of Food in the Last Year: Not on file  Transportation Needs:   . Lack of Transportation (Medical): Not on file  . Lack of Transportation (Non-Medical): Not on file  Physical Activity:   . Days of Exercise per Week: Not on file  . Minutes of Exercise per Session: Not on file  Stress:   . Feeling of Stress :  Not on file  Social Connections:   . Frequency of Communication with Friends and Family: Not on file  . Frequency of Social Gatherings with Friends and Family: Not on file  . Attends Religious Services: Not on file  . Active Member of Clubs or Organizations: Not on file  . Attends Banker Meetings: Not on file  . Marital Status: Not on file   Additional Social History:     Sleep: Fair  Appetite:  Good  Current Medications: Current Facility-Administered Medications  Medication Dose Route Frequency Provider Last Rate Last Admin  . acetaminophen (TYLENOL) tablet 650 mg  650 mg Oral Q6H PRN Antonieta Pert, MD      . alum &  mag hydroxide-simeth (MAALOX/MYLANTA) 200-200-20 MG/5ML suspension 30 mL  30 mL Oral Q4H PRN Antonieta Pert, MD      . divalproex (DEPAKOTE) DR tablet 750 mg  750 mg Oral QPM Laveda Abbe, NP   750 mg at 10/09/20 1846  . hydrOXYzine (ATARAX/VISTARIL) tablet 25 mg  25 mg Oral TID PRN Antonieta Pert, MD      . OLANZapine zydis (ZYPREXA) disintegrating tablet 5 mg  5 mg Oral Q8H PRN Antonieta Pert, MD       And  . LORazepam (ATIVAN) tablet 1 mg  1 mg Oral Q4H PRN Antonieta Pert, MD       And  . ziprasidone (GEODON) injection 20 mg  20 mg Intramuscular Q6H PRN Antonieta Pert, MD      . magnesium hydroxide (MILK OF MAGNESIA) suspension 30 mL  30 mL Oral Daily PRN Antonieta Pert, MD      . OLANZapine [facility]) tablet 5 mg  5 mg Oral QHS Estella Husk, MD   5 mg at 10/09/20 2049  . traZODone (DESYREL) tablet 50 mg  50 mg Oral QHS PRN Antonieta Pert, MD        Lab Results: No results found for this or any previous visit (from the past 48 hour(s)).  Blood Alcohol level:  Lab Results  Component Value Date   ETH <10 10/03/2020   ETH <10 08/03/2020    Metabolic Disorder Labs: Lab Results  Component Value Date   HGBA1C 5.0 08/05/2020   MPG 96.8 08/05/2020   No results found for: PROLACTIN Lab Results  Component Value Date   CHOL 176 08/05/2020   TRIG 58 08/05/2020   HDL 59 08/05/2020   CHOLHDL 3.0 08/05/2020   VLDL 12 08/05/2020   LDLCALC 105 (H) 08/05/2020    Physical Findings: AIMS: Facial and Oral Movements Muscles of Facial Expression: None, normal Lips and Perioral Area: None, normal Jaw: None, normal Tongue: None, normal,Extremity Movements Upper (arms, wrists, hands, fingers): None, normal Lower (legs, knees, ankles, toes): None, normal, Trunk Movements Neck, shoulders, hips: None, normal, Overall Severity Severity of abnormal movements (highest score from questions above): None, normal Incapacitation due to abnormal movements:  None, normal Patient's awareness of abnormal movements (rate only patient's report): No Awareness, Dental Status Current problems with teeth and/or dentures?: No Does patient usually wear dentures?: No  CIWA:    COWS:     Musculoskeletal: Strength & Muscle Tone: within normal limits Gait & Station: normal Patient leans: N/A  Psychiatric Specialty Exam: Physical Exam  Review of Systems  Blood pressure 120/65, pulse 76, temperature 98.2 F (36.8 C), temperature source Oral, resp. rate 18, height 5\' 7"  (1.702 m), weight 68.9 kg, SpO2 99 %.Body mass index is  23.81 kg/m.  General Appearance: Casual and Fairly Groomed  Eye Contact:  Fair  Speech:  Clear and Coherent and Normal Rate  Volume:  Normal  Mood:  Anxious  Affect:  Congruent  Thought Process:  Linear and Descriptions of Associations: Intact  Orientation:  Full (Time, Place, and Person)  Thought Content:  Logical  Suicidal Thoughts:  No  Homicidal Thoughts:  No  Memory:  Immediate;   Fair Recent;   Fair Remote;   Fair  Judgement:  Fair  Insight:  Fair  Psychomotor Activity:  Normal  Concentration:  Concentration: Fair and Attention Span: Fair  Recall:  [employer] of Knowledge:  Fair  Language:  Good  Akathisia:  No  Handed:  Right  AIMS (if indicated):     Assets:  Desire for Improvement Financial Resources/Insurance Housing Physical Health Social Support Vocational/Educational  ADL's:  Intact  Cognition:  WNL  Sleep:  Number of Hours: 5.75     Treatment Plan Summary: Daily contact with patient to assess and evaluate symptoms and progress in treatment and Medication management   Plan: 1.  Continue  Depakote DR to 750 mg p.o. every evening for mood      dysregulation. 2.  Increase  Zyprexa to 10 mg qhs and titrate for mood      dysregulation and sedation. 3.  Continue hydroxyzine 25 mg p.o. 3 times daily as needed      anxiety. 4.  Continue trazodone 50 mg p.o. nightly as needed insomnia. 5.  Disposition  planning-in progress.- to be discharged to law      enforcement-duty to warn    Laveda Abbe, NP 10/10/2020, 11:36 AM

## 2020-10-10 NOTE — Progress Notes (Signed)
Recreation Therapy Notes  Date:  11.24.21 Time: 0930 Location: [address] Dayroom  Group Topic: Stress Management  Goal Area(s) Addresses:  Patient will identify positive stress management techniques. Patient will identify benefits of using stress management post d/c.  Intervention: Stress Management  Activity: Meditation.  LRT played a meditation that focused on finding happiness in the present moment.  Patients were to listen and follow along as meditation played in order to fully engage in activity.    Education:  Stress Management, Discharge Planning.   Education Outcome: Acknowledges Education  Clinical Observations/Feedback:  Pt did not attend group activity.    Caroll Rancher, LRT/CTRS        Caroll Rancher A 10/10/2020 11:49 AM

## 2020-10-10 NOTE — Progress Notes (Signed)
Pt denies SI/HI/AVH.  Pt's thought processes seemed more clear today and he was not as tangential with his speech.  Pt was assertive and made his needs known, and pt was respectful and appreciative of care pt has received on the unit.  RN administered medications as ordered and provided assistance.  Pt said that he is doing well and he  is hoping he can be discharged home as soon as possible.  Pt remains safe with q 15 min checks in place.

## 2020-10-10 NOTE — Progress Notes (Signed)
Pt is very minimal and guarded upon assessment tonight. Pt said that SI is what brought him to [facility]. He has been in the dayroom interacting with his peers. He was on the phone several times tonight. Pt was talking about the phone use limitations he'll have once he leaves here. Pt denies SI/HI and AVH. Medications administered as ordered by MD. Active listening, reassurance, and support provided. Q 15 min safety checks continue. Pt's safety has been maintained.   10/09/20 2049  Psych Admission Type (Psych Patients Only)  Admission Status Involuntary  Psychosocial Assessment  Patient Complaints None  Eye Contact Fair  Facial Expression Animated  Affect Flat  Speech Pressured  Interaction Assertive  Motor Activity Other (Comment) (WDL)  Appearance/Hygiene Unremarkable  Behavior Characteristics Cooperative;Anxious;Calm  Mood Anxious;Pleasant  Thought Process  Coherency Concrete thinking  Content WDL  Delusions None reported or observed  Perception WDL  Hallucination None reported or observed  Judgment Impaired  Confusion None  Danger to Self  Current suicidal ideation? Denies  Danger to Others  Danger to Others None reported or observed

## 2020-10-11 DIAGNOSIS — F319 Bipolar disorder, unspecified: Secondary | ICD-10-CM

## 2020-10-11 NOTE — Progress Notes (Signed)
Pt rates his depression, anxiety and hopelessness all 0/10. Reports he slept well last night with good appetite. Observed on phone majority of this morning talking to his family, sounded irritated with his mother but was redirectable. Denies SI, HI, AVH and pain. Endorsed being saddened by current status and stay at [facility] "I've been in here for over 6 days. It's Thanksgiving and my parents don't have none of their kids here with them. I don't want to sit in jail all weekend but everybody that I could have conversations with have left. So I'm sad". Emotional support offered to pt and encouraged by writer to voice concerns. Medications administered as ordered with verbal education and effects monitored. Q 15 minutes safety checks maintained without self harm gestures or outburst.  Pt tolerates meals and fluids well. Denies adverse drug reactions when assessed. Denies physical discomfort at this time. Remains safe on and off unit.

## 2020-10-11 NOTE — Progress Notes (Signed)
   10/11/20 2200  Psych Admission Type (Psych Patients Only)  Admission Status Involuntary  Psychosocial Assessment  Patient Complaints None  Eye Contact Fair  Facial Expression Flat  Affect Anxious  Speech Pressured  Interaction Assertive  Motor Activity Other (Comment) (WNL)  Appearance/Hygiene Unremarkable  Behavior Characteristics Appropriate to situation  Mood Anxious  Thought Process  Coherency WDL  Content WDL  Delusions None reported or observed  Perception WDL  Hallucination None reported or observed  Judgment WDL  Confusion None  Danger to Self  Current suicidal ideation? Denies  Danger to Others  Danger to Others None reported or observed

## 2020-10-11 NOTE — Progress Notes (Signed)
[facility] MD Progress Note  10/11/2020 11:09 AM Keith Guerrero  MRN:  [id number]  Subjective:  Keith Guerrero reports, "Today is my day 6th in this hospital, I feel bored. I'm ready to go home, but I know there are some legal issues that have to be dealt with after my discharge. I'm thinking that it will not make any sense to discharge me being today Thanksgiving day or during the weekend to go sit in jail till next week. I feel it is better to discharge me on Monday 10-15-20. Other than this, I'm doing good. I miss being outside though. I'm a landscaper".  Objective: Patient was seen and evaluated on the unit. He is calm, cooperative and pleasant. His thought process is disorganized. He was able to stay on topic and answer questions appropriately for a short time and then began talking about school, going abroad to study and various other things. He feels his medications are helping and that he is getting better. Will increase Depakote today.  His thought process is mostly factual about his life, school and his parents. He is a bit more animated today but still somewhat rigid in his posture. He is able to hold a coherent, linear conversation. His parents are actively involved in seeking outpatient treatment. He is asking how much longer he has to stay and stated he does not know how much more good he can do by staying here. Explained top patient that medications are still being adjusted for effectiveness. He agrees he wants to be stable when he leaves. He denies depression and anxiety. He denies suicidal thoughts, plan or intent. He has no homicidal thoughts and denies hallucinations. He has remained calm and cooperative on the unit. His affect is flat. He is attending group therapy and stated he is sleeping and eating well. Will continue to monitor for safety and stabilization.   Principal Problem: Bipolar affective disorder, manic, severe, with psychotic behavior (HCC)  Diagnosis: Principal Problem:   Bipolar  affective disorder, manic, severe, with psychotic behavior (HCC)  Total Time spent with patient: 35 minutes  Past Psychiatric History: See H&P  Past Medical History:  Past Medical History:  Diagnosis Date  . Bipolar disorder (HCC)   . Pneumonia     Past Surgical History:  Procedure Laterality Date  . WISDOM TOOTH EXTRACTION     Family History:  Family History  Problem Relation Age of Onset  . Healthy Mother   . Healthy Father   . Hypertension Other    Family Psychiatric  History: See H&P Social History:  Social History   Substance and Sexual Activity  Alcohol Use No     Social History   Substance and Sexual Activity  Drug Use No    Social History   Socioeconomic History  . Marital status: Single    Spouse name: Not on file  . Number of children: Not on file  . Years of education: Not on file  . Highest education level: Not on file  Occupational History  . Not on file  Tobacco Use  . Smoking status: Never Smoker  . Smokeless tobacco: Never Used  Vaping Use  . Vaping Use: Never used  Substance and Sexual Activity  . Alcohol use: No  . Drug use: No  . Sexual activity: Never  Other Topics Concern  . Not on file  Social History Narrative  . Not on file   Social Determinants of Health   Financial Resource Strain:   . Difficulty of Paying  Living Expenses: Not on file  Food Insecurity:   . Worried About Programme researcher, broadcasting/film/video in the Last Year: Not on file  . Ran Out of Food in the Last Year: Not on file  Transportation Needs:   . Lack of Transportation (Medical): Not on file  . Lack of Transportation (Non-Medical): Not on file  Physical Activity:   . Days of Exercise per Week: Not on file  . Minutes of Exercise per Session: Not on file  Stress:   . Feeling of Stress : Not on file  Social Connections:   . Frequency of Communication with Friends and Family: Not on file  . Frequency of Social Gatherings with Friends and Family: Not on file  . Attends  Religious Services: Not on file  . Active Member of Clubs or Organizations: Not on file  . Attends Banker Meetings: Not on file  . Marital Status: Not on file   Additional Social History:     Sleep: Fair  Appetite:  Good  Current Medications: Current Facility-Administered Medications  Medication Dose Route Frequency Provider Last Rate Last Admin  . acetaminophen (TYLENOL) tablet 650 mg  650 mg Oral Q6H PRN Antonieta Pert, MD      . alum & mag hydroxide-simeth (MAALOX/MYLANTA) 200-200-20 MG/5ML suspension 30 mL  30 mL Oral Q4H PRN Antonieta Pert, MD      . divalproex (DEPAKOTE) DR tablet 750 mg  750 mg Oral QPM Laveda Abbe, NP   750 mg at 10/10/20 1740  . hydrOXYzine (ATARAX/VISTARIL) tablet 25 mg  25 mg Oral TID PRN Antonieta Pert, MD      . OLANZapine zydis (ZYPREXA) disintegrating tablet 5 mg  5 mg Oral Q8H PRN Antonieta Pert, MD       And  . LORazepam (ATIVAN) tablet 1 mg  1 mg Oral Q4H PRN Antonieta Pert, MD       And  . ziprasidone (GEODON) injection 20 mg  20 mg Intramuscular Q6H PRN Antonieta Pert, MD      . magnesium hydroxide (MILK OF MAGNESIA) suspension 30 mL  30 mL Oral Daily PRN Antonieta Pert, MD      . OLANZapine [facility]) tablet 10 mg  10 mg Oral QHS Laveda Abbe, NP   10 mg at 10/10/20 2056  . traZODone (DESYREL) tablet 50 mg  50 mg Oral QHS PRN Antonieta Pert, MD        Lab Results: No results found for this or any previous visit (from the past 48 hour(s)).  Blood Alcohol level:  Lab Results  Component Value Date   ETH <10 10/03/2020   ETH <10 08/03/2020    Metabolic Disorder Labs: Lab Results  Component Value Date   HGBA1C 5.0 08/05/2020   MPG 96.8 08/05/2020   No results found for: PROLACTIN Lab Results  Component Value Date   CHOL 176 08/05/2020   TRIG 58 08/05/2020   HDL 59 08/05/2020   CHOLHDL 3.0 08/05/2020   VLDL 12 08/05/2020   LDLCALC 105 (H) 08/05/2020    Physical  Findings: AIMS: Facial and Oral Movements Muscles of Facial Expression: None, normal Lips and Perioral Area: None, normal Jaw: None, normal Tongue: None, normal,Extremity Movements Upper (arms, wrists, hands, fingers): None, normal Lower (legs, knees, ankles, toes): None, normal, Trunk Movements Neck, shoulders, hips: None, normal, Overall Severity Severity of abnormal movements (highest score from questions above): None, normal Incapacitation due to abnormal movements: None, normal  Patient's awareness of abnormal movements (rate only patient's report): No Awareness, Dental Status Current problems with teeth and/or dentures?: No Does patient usually wear dentures?: No  CIWA:    COWS:     Musculoskeletal: Strength & Muscle Tone: within normal limits Gait & Station: normal Patient leans: N/A  Psychiatric Specialty Exam: Physical Exam Vitals and nursing note reviewed.     Review of Systems  Blood pressure 125/78, pulse 80, temperature 98.2 F (36.8 C), temperature source Oral, resp. rate 18, height 5\' 7"  (1.702 m), weight 68.9 kg, SpO2 99 %.Body mass index is 23.81 kg/m.  General Appearance: Casual and Fairly Groomed  Eye Contact:  Fair  Speech:  Clear and Coherent and Normal Rate  Volume:  Normal  Mood:  Anxious  Affect:  Congruent  Thought Process:  Linear and Descriptions of Associations: Intact  Orientation:  Full (Time, Place, and Person)  Thought Content:  Logical  Suicidal Thoughts:  No  Homicidal Thoughts:  No  Memory:  Immediate;   Fair Recent;   Fair Remote;   Fair  Judgement:  Fair  Insight:  Fair  Psychomotor Activity:  Normal  Concentration:  Concentration: Fair and Attention Span: Fair  Recall:  of Knowledge:  Fair  Language:  Good  Akathisia:  No  Handed:  Right  AIMS (if indicated):     Assets:  Desire for Improvement Financial Resources/Insurance Housing Physical Health Social Support Vocational/Educational  ADL's:  Intact   Cognition:  WNL  Sleep:  Number of Hours: 6   Treatment Plan Summary: Daily contact with patient to assess and evaluate symptoms and progress in treatment and Medication management   Continue inpatient hospitalization. Will continue today 10/11/2020 plan as below except where it is noted.  Plan: 1.  Continue  Depakote DR to 750 mg p.o. every evening for mood dysregulation. 2.  Continue  Zyprexa to 10 mg qhs for mood control. 3.  Continue hydroxyzine 25 mg p.o. 3 times daily as needed for anxiety. 4.  Continue trazodone 50 mg p.o. nightly as needed for insomnia. 5.  Disposition planning-in progress.- to be discharged to law      enforcement-duty to warn  10/13/2020, NP, PMHNP, FNP-BC 10/11/2020, 11:09 AMPatient ID: [id number], male   DOB: Jul 16, 1996, 24 y.o.   MRN: [id number]

## 2020-10-12 MED ORDER — OLANZAPINE 7.5 MG PO TABS
7.5000 mg | ORAL_TABLET | Freq: Every day | ORAL | Status: DC
  Administered 2020-10-12 – 2020-10-15 (×4): 7.5 mg via ORAL
  Filled 2020-10-12 (×5): qty 1

## 2020-10-12 NOTE — Progress Notes (Signed)
[facility] MD Progress Note  10/12/2020 11:25 AM Keith Guerrero  MRN:  [id number]  Subjective:  Keith Guerrero reports, "I'm doing, just having a lot of drowsiness. I believe it is due the high dose of Zyprexa. I'm wondering if this Zyprexa dose to can be decreased some. The Depakote s helping my manic state. It levels me. I'm not depressed, just bored".  Objective: Patient is a 24 year old male with a past psychiatric history significant for probable bipolar disorder who presented to the [facility] emergency department on 10/03/2020 after a bizarre incident with police as well as apparently threatening to kill the [place]. The patient had a previous admission to our facility on 08/04/2020. I assessed the patient. He was noted to be manic at that time.  Keith Guerrero is seen, chart reviewed. The chart findings discussed with the treatment team. He is calm, cooperative/pleasant. His thought process is more logical. He is able to stay on topic & answers questions appropriately He feels his medications are helping and that he is getting better. Will Depakote today.  His thought process is mostly factual about his life, school and his parents. He is able to hold a coherent, linear conversation. His parents are actively involved in seeking outpatient treatment. He is asking how much longer he has to stay and stated he does not know how much more good he can do by staying here. Explained top patient that medications are still being adjusted for effectiveness. Scheduled Depakote level check tomorrow morning. He agrees he wants to be stable when he leaves. He denies depression and anxiety. He denies suicidal thoughts, plan or intent. He has no homicidal thoughts and denies hallucinations, delusions or paranoia. He has remained calm and cooperative on the unit with an improved affect. He is attending group therapy. Complained of excessive drowsiness. Decreased Zyprexa from 10 mf  to 7.5 mg po Q hs. Will continue to monitor for safety and stabilization. Keith Guerrero is in agreement to continue current plan of care as already in progress.  Principal Problem: Bipolar affective disorder, manic, severe, with psychotic behavior (HCC)  Diagnosis: Principal Problem:   Bipolar affective disorder, manic, severe, with psychotic behavior (HCC)  Total Time spent with patient: 15 minutes  Past Psychiatric History: See H&P  Past Medical History:  Past Medical History:  Diagnosis Date   Bipolar disorder (HCC)    Pneumonia     Past Surgical History:  Procedure Laterality Date   WISDOM TOOTH EXTRACTION     Family History:  Family History  Problem Relation Age of Onset   Healthy Mother    Healthy Father    Hypertension Other    Family Psychiatric  History: See H&P  Social History:  Social History   Substance and Sexual Activity  Alcohol Use No     Social History   Substance and Sexual Activity  Drug Use No    Social History   Socioeconomic History   Marital status: Single    Spouse name: Not on file   Number of children: Not on file   Years of education: Not on file   Highest education level: Not on file  Occupational History   Not on file  Tobacco Use   Smoking status: Never Smoker   Smokeless tobacco: Never Used  Vaping Use   Vaping Use: Never used  Substance and Sexual Activity   Alcohol use: No   Drug use: No   Sexual activity: Never  Other Topics  Concern   Not on file  Social History Narrative   Not on file   Social Determinants of Health   Financial Resource Strain:    Difficulty of Paying Living Expenses: Not on file  Food Insecurity:    Worried About Running Out of Food in the Last Year: Not on file   Ran Out of Food in the Last Year: Not on file  Transportation Needs:    Lack of Transportation (Medical): Not on file   Lack of Transportation (Non-Medical): Not on file  Physical Activity:    Days of Exercise  per Week: Not on file   Minutes of Exercise per Session: Not on file  Stress:    Feeling of Stress : Not on file  Social Connections:    Frequency of Communication with Friends and Family: Not on file   Frequency of Social Gatherings with Friends and Family: Not on file   Attends Religious Services: Not on file   Active Member of Clubs or Organizations: Not on file   Attends Banker Meetings: Not on file   Marital Status: Not on file   Additional Social History:   Sleep: Good  Appetite:  Good  Current Medications: Current Facility-Administered Medications  Medication Dose Route Frequency Provider Last Rate Last Admin   acetaminophen (TYLENOL) tablet 650 mg  650 mg Oral Q6H PRN Antonieta Pert, MD       alum & mag hydroxide-simeth (MAALOX/MYLANTA) 200-200-20 MG/5ML suspension 30 mL  30 mL Oral Q4H PRN Antonieta Pert, MD       divalproex (DEPAKOTE) DR tablet 750 mg  750 mg Oral QPM Laveda Abbe, NP   750 mg at 10/11/20 1750   hydrOXYzine (ATARAX/VISTARIL) tablet 25 mg  25 mg Oral TID PRN Antonieta Pert, MD       OLANZapine zydis (ZYPREXA) disintegrating tablet 5 mg  5 mg Oral Q8H PRN Antonieta Pert, MD       And   LORazepam (ATIVAN) tablet 1 mg  1 mg Oral Q4H PRN Antonieta Pert, MD       And   ziprasidone (GEODON) injection 20 mg  20 mg Intramuscular Q6H PRN Antonieta Pert, MD       magnesium hydroxide (MILK OF MAGNESIA) suspension 30 mL  30 mL Oral Daily PRN Antonieta Pert, MD       OLANZapine (ZYPREXA) tablet 7.5 mg  7.5 mg Oral QHS Cortni Tays I, NP       traZODone (DESYREL) tablet 50 mg  50 mg Oral QHS PRN Antonieta Pert, MD        Lab Results: No results found for this or any previous visit (from the past 48 hour(s)).  Blood Alcohol level:  Lab Results  Component Value Date   ETH <10 10/03/2020   ETH <10 08/03/2020   Metabolic Disorder Labs: Lab Results  Component Value Date   HGBA1C 5.0  08/05/2020   MPG 96.8 08/05/2020   No results found for: PROLACTIN Lab Results  Component Value Date   CHOL 176 08/05/2020   TRIG 58 08/05/2020   HDL 59 08/05/2020   CHOLHDL 3.0 08/05/2020   VLDL 12 08/05/2020   LDLCALC 105 (H) 08/05/2020   Physical Findings: AIMS: Facial and Oral Movements Muscles of Facial Expression: None, normal Lips and Perioral Area: None, normal Jaw: None, normal Tongue: None, normal,Extremity Movements Upper (arms, wrists, hands, fingers): None, normal Lower (legs, knees, ankles, toes): None, normal, Trunk Movements  Neck, shoulders, hips: None, normal, Overall Severity Severity of abnormal movements (highest score from questions above): None, normal Incapacitation due to abnormal movements: None, normal Patient's awareness of abnormal movements (rate only patient's report): No Awareness, Dental Status Current problems with teeth and/or dentures?: No Does patient usually wear dentures?: No  CIWA:    COWS:     Musculoskeletal: Strength & Muscle Tone: within normal limits Gait & Station: normal Patient leans: N/A  Psychiatric Specialty Exam: Physical Exam Vitals and nursing note reviewed.  HENT:     Head: Normocephalic.     Nose: Nose normal.     Mouth/Throat:     Pharynx: Oropharynx is clear.  Eyes:     Pupils: Pupils are equal, round, and reactive to light.  Cardiovascular:     Rate and Rhythm: Normal rate.     Pulses: Normal pulses.  Pulmonary:     Effort: Pulmonary effort is normal.  Genitourinary:    Comments: Deferred Musculoskeletal:        General: Normal range of motion.     Cervical back: Normal range of motion.  Skin:    General: Skin is warm and dry.  Neurological:     Mental Status: He is alert and oriented to person, place, and time.     Review of Systems  Constitutional: Negative for chills, diaphoresis and fever.  HENT: Negative for congestion, rhinorrhea, sneezing and sore throat.   Eyes: Negative for discharge.   Respiratory: Negative for cough, chest tightness, shortness of breath and wheezing.   Cardiovascular: Negative for chest pain and palpitations.  Gastrointestinal: Negative for diarrhea, nausea and vomiting.  Endocrine: Negative for cold intolerance.  Genitourinary: Negative for difficulty urinating.  Musculoskeletal: Negative for arthralgias and myalgias.  Allergic/Immunologic:       Allergies: Fish  Neurological: Negative for dizziness, tremors, seizures, syncope, facial asymmetry, speech difficulty, weakness, light-headedness, numbness and headaches.  Psychiatric/Behavioral: Positive for dysphoric mood (Reports daily improvement)). Negative for agitation, behavioral problems, confusion, decreased concentration, hallucinations, self-injury, sleep disturbance and suicidal ideas. The patient is not nervous/anxious and is not hyperactive.     Blood pressure 121/67, pulse 79, temperature 98 F (36.7 C), temperature source Oral, resp. rate 14, height 5\' 7"  (1.702 m), weight 68.9 kg, SpO2 100 %.Body mass index is 23.81 kg/m.  General Appearance: Casual and Fairly Groomed  Eye Contact:  Fair  Speech:  Clear and Coherent and Normal Rate  Volume:  Normal  Mood:  "Improving"  Affect:  Congruent  Thought Process:  Linear and Descriptions of Associations: Intact  Orientation:  Full (Time, Place, and Person)  Thought Content:  Logical  Suicidal Thoughts:  Denies any thoughts, plans or intent/  Homicidal Thoughts:  No  Memory:  Immediate;   Good Recent;   Good Remote;   Good  Judgement:  Fair  Insight:  Fair  Psychomotor Activity:  Normal  Concentration:  Concentration: Fair and Attention Span: Fair  Recall:  [employer]Fair  Fund of Knowledge:  Fair  Language:  Good  Akathisia:  No  Handed:  Right  AIMS (if indicated):     Assets:  Desire for Improvement Financial Resources/Insurance Housing Physical Health Social Support Vocational/Educational  ADL's:  Intact  Cognition:  WNL  Sleep:   Number of Hours: 6   Treatment Plan Summary: Daily contact with patient to assess and evaluate symptoms and progress in treatment and Medication management   Continue inpatient hospitalization. Will continue today 10/12/2020 plan as below except where it is noted.  Plan: 1.  Continue  Depakote DR 750 mg p.o. every evening for mood dysregulation. 2.  Decreased  Zyprexa from 10 mg to Zyprexa 7.5 mg po qhs for mood control. 3.  Continue hydroxyzine 25 mg p.o. 3 times daily as needed for anxiety. 4.  Continue trazodone 50 mg p.o. nightly as needed for insomnia. 5.  Disposition planning-in progress.- to be discharged to the law enforcement-duty to warn. 6.  Depakote level to be drawn tomorrow 10-13-20  Keith Stammer, NP, PMHNP, FNP-BC 10/12/2020, 11:25 AMPatient ID: Keith Guerrero, male   DOB: 1996-06-12, 24 y.o.   MRN: [id number] Patient ID: Keith Guerrero, male   DOB: [birth year], 24 y.o.   MRN: [id number]

## 2020-10-12 NOTE — Progress Notes (Signed)
Pt has been alert and oriented to person, place, time and situation. Pt is calm, cooperative, denies suicidal and homicidal ideation, denies hallucinations, denies feelings of depression and anxiety. No distress noted, none reported. Pt reports he feels "trapped in here. I might as well be in a jail." Pt has been spends time on the phone often, talking about his upcomming court issues. Pt's thoughts are logical, pt has fair insight regarding admission triggers. Pt c/o being board, emotional support provided, pt encouraged to attends and participate in groups. No distress noted, none reported. Will continue to monitor pt per Q15 minute face checks and monitor for safety and progress.

## 2020-10-12 NOTE — Progress Notes (Signed)
Patient reports that the Zyprexa 10 mg at bedtime is causing him to feel groggy in the morning and sleep during the morning.

## 2020-10-12 NOTE — [facility] Group Notes (Signed)
LCSW Group Therapy Note  10/12/2020   1430  Type of Therapy and Topic:  Group Therapy: What's So Funny About Mental Illness?  Participation Level:  Did Not Attend   Description of Group:   In this group, patients learned how to recognize how they personally define their mental illness. This group also addressed stigma associated with mental illness.  Patients were asked to give examples of experiences surrounding living with a mental illness and how it makes them feel. Patients were asked to self-reflect on how they have chosen to address their mental health thus far and were invited to share those lessons or to discuss how stigma has affected their mental health care. Patients actively explore how their mental illness has impacted decisions and actions as well as how future decisions can be impacted.  Therapeutic Goals: 1. Patients will identify what can be considered mental illness 2. Patients will identify lessons learned from past experiences and how they can be applied to future struggles. 3. Patients will establish rapport with peers in a therapeutic setting.  Summary of Patient Progress:  Keith Guerrero did not attend the group  Therapeutic Modalities:   Cognitive Behavioral Therapy    Aram Beecham, LCSW 10/12/2020  2:04 PM

## 2020-10-12 NOTE — Tx Team (Signed)
Interdisciplinary Treatment and Diagnostic Plan Update  10/12/2020 Time of Session: 10:00am  Keith Guerrero MRN: [id number]  Principal Diagnosis: Bipolar affective disorder, manic, severe, with psychotic behavior (HCC)  Secondary Diagnoses: Principal Problem:   Bipolar affective disorder, manic, severe, with psychotic behavior (HCC)   Current Medications:  Current Facility-Administered Medications  Medication Dose Route Frequency Provider Last Rate Last Admin  . acetaminophen (TYLENOL) tablet 650 mg  650 mg Oral Q6H PRN Antonieta Pert, MD      . alum & mag hydroxide-simeth (MAALOX/MYLANTA) 200-200-20 MG/5ML suspension 30 mL  30 mL Oral Q4H PRN Antonieta Pert, MD      . divalproex (DEPAKOTE) DR tablet 750 mg  750 mg Oral QPM Laveda Abbe, NP   750 mg at 10/11/20 1750  . hydrOXYzine (ATARAX/VISTARIL) tablet 25 mg  25 mg Oral TID PRN Antonieta Pert, MD      . OLANZapine zydis (ZYPREXA) disintegrating tablet 5 mg  5 mg Oral Q8H PRN Antonieta Pert, MD       And  . LORazepam (ATIVAN) tablet 1 mg  1 mg Oral Q4H PRN Antonieta Pert, MD       And  . ziprasidone (GEODON) injection 20 mg  20 mg Intramuscular Q6H PRN Antonieta Pert, MD      . magnesium hydroxide (MILK OF MAGNESIA) suspension 30 mL  30 mL Oral Daily PRN Antonieta Pert, MD      . OLANZapine [facility]) tablet 10 mg  10 mg Oral QHS Laveda Abbe, NP   10 mg at 10/11/20 2046  . traZODone (DESYREL) tablet 50 mg  50 mg Oral QHS PRN Antonieta Pert, MD       PTA Medications: Medications Prior to Admission  Medication Sig Dispense Refill Last Dose  . OXcarbazepine (TRILEPTAL) 150 MG tablet Take 0.5 tablets (75 mg total) by mouth 2 (two) times daily. (Patient not taking: Reported on 10/03/2020) 60 tablet 0     Patient Stressors: Marital or family conflict Medication change or noncompliance Other: Chronic mental illness  Patient Strengths: Metallurgist  fund of knowledge Physical Health Supportive family/friends  Treatment Modalities: Medication Management, Group therapy, Case management,  1 to 1 session with clinician, Psychoeducation, Recreational therapy.   Physician Treatment Plan for Primary Diagnosis: Bipolar affective disorder, manic, severe, with psychotic behavior (HCC) Long Term Goal(s): Improvement in symptoms so as ready for discharge Improvement in symptoms so as ready for discharge   Short Term Goals: Ability to identify changes in lifestyle to reduce recurrence of condition will improve Ability to verbalize feelings will improve Ability to demonstrate self-control will improve Ability to identify and develop effective coping behaviors will improve Ability to maintain clinical measurements within normal limits will improve Compliance with prescribed medications will improve Ability to identify triggers associated with substance abuse/mental health issues will improve Ability to identify changes in lifestyle to reduce recurrence of condition will improve Ability to verbalize feelings will improve Ability to demonstrate self-control will improve Ability to identify and develop effective coping behaviors will improve Ability to maintain clinical measurements within normal limits will improve Compliance with prescribed medications will improve  Medication Management: Evaluate patient's response, side effects, and tolerance of medication regimen.  Therapeutic Interventions: 1 to 1 sessions, Unit Group sessions and Medication administration.  Evaluation of Outcomes: Progressing  Physician Treatment Plan for Secondary Diagnosis: Principal Problem:   Bipolar affective disorder, manic, severe, with psychotic behavior (HCC)  Long Term  Goal(s): Improvement in symptoms so as ready for discharge Improvement in symptoms so as ready for discharge   Short Term Goals: Ability to identify changes in lifestyle to reduce recurrence  of condition will improve Ability to verbalize feelings will improve Ability to demonstrate self-control will improve Ability to identify and develop effective coping behaviors will improve Ability to maintain clinical measurements within normal limits will improve Compliance with prescribed medications will improve Ability to identify triggers associated with substance abuse/mental health issues will improve Ability to identify changes in lifestyle to reduce recurrence of condition will improve Ability to verbalize feelings will improve Ability to demonstrate self-control will improve Ability to identify and develop effective coping behaviors will improve Ability to maintain clinical measurements within normal limits will improve Compliance with prescribed medications will improve     Medication Management: Evaluate patient's response, side effects, and tolerance of medication regimen.  Therapeutic Interventions: 1 to 1 sessions, Unit Group sessions and Medication administration.  Evaluation of Outcomes: Progressing   RN Treatment Plan for Primary Diagnosis: Bipolar affective disorder, manic, severe, with psychotic behavior (HCC) Long Term Goal(s): Knowledge of disease and therapeutic regimen to maintain health will improve  Short Term Goals: Ability to remain free from injury will improve, Ability to demonstrate self-control, Ability to participate in decision making will improve, Ability to verbalize feelings will improve, Ability to disclose and discuss suicidal ideas, Ability to identify and develop effective coping behaviors will improve and Compliance with prescribed medications will improve  Medication Management: RN will administer medications as ordered by provider, will assess and evaluate patient's response and provide education to patient for prescribed medication. RN will report any adverse and/or side effects to prescribing provider.  Therapeutic Interventions: 1 on 1  counseling sessions, Psychoeducation, Medication administration, Evaluate responses to treatment, Monitor vital signs and CBGs as ordered, Perform/monitor CIWA, COWS, AIMS and Fall Risk screenings as ordered, Perform wound care treatments as ordered.  Evaluation of Outcomes: Progressing   LCSW Treatment Plan for Primary Diagnosis: Bipolar affective disorder, manic, severe, with psychotic behavior (HCC) Long Term Goal(s): Safe transition to appropriate next level of care at discharge, Engage patient in therapeutic group addressing interpersonal concerns.  Short Term Goals: Engage patient in aftercare planning with referrals and resources, Increase social support, Increase ability to appropriately verbalize feelings, Increase emotional regulation, Facilitate acceptance of mental health diagnosis and concerns, Facilitate patient progression through stages of change regarding substance use diagnoses and concerns and Identify triggers associated with mental health/substance abuse issues  Therapeutic Interventions: Assess for all discharge needs, 1 to 1 time with Social worker, Explore available resources and support systems, Assess for adequacy in community support network, Educate family and significant other(s) on suicide prevention, Complete Psychosocial Assessment, Interpersonal group therapy.  Evaluation of Outcomes: Progressing   Progress in Treatment: Attending groups: No. Participating in groups: No. Taking medication as prescribed: Yes. Toleration medication: Yes. Family/Significant other contact made: Yes, individual(s) contacted:  Pt's parents. They are now his guardian. Patient understands diagnosis: Yes. Discussing patient identified problems/goals with staff: Yes. Medical problems stabilized or resolved: Yes. Denies suicidal/homicidal ideation: Yes. Issues/concerns per patient self-inventory: No.   New problem(s) identified: No, Describe:  None  New Short Term/Long Term  Goal(s): medication stabilization, elimination of SI thoughts, development of comprehensive mental wellness plan.   Patient Goals:  Did not attend  Discharge Plan or Barriers: Pt's family interested in intensive OP for services. Pt will likely discharge to a PHP program.  Reason for Continuation of Hospitalization:  Delusions  Medication stabilization Withdrawal symptoms  Estimated Length of Stay: 3 to 5 days   Attendees: Patient:  10/08/2020   Physician: 10/08/2020   Nursing:  10/08/2020   RN Care Manager: 10/08/2020   Social Worker: Jacinta Shoe, LCSW 10/08/2020   Recreational Therapist:  10/08/2020   Other:  10/08/2020   Other:  10/08/2020   Other: 10/08/2020     Scribe for Treatment Team: Jacinta Shoe, LCSW 10/12/2020 8:42 AM

## 2020-10-12 NOTE — [facility] Counselor (Signed)
CSW spoke with Keith Guerrero who states that Keith Guerrero's attorney will be speaking with the Magistrate on Monday to allow Keith Guerrero to go to [facility] instead of jail.  Keith Guerrero states that jail is not an option and that they are trying to get it set up so she and her husband and pick Estevan up from the hospital and take him to [facility].  Keith Guerrero states that she does not want Marsden living on his own but also does not want Ethaniel living with her.  Keith Guerrero states that she does not know where Sajan will live when he is outside of the [facility] program but would like him to be in a long term facility where someone can watch him daily. Keith Guerrero states that she is aware that he would be back and forth from the hospital but is not sure how the family will manage that.  Keith Guerrero states that she would like to speak to a doctor and states that Kelii has told her that he has not spoken to a doctor since Monday.  Keith Guerrero states that she will be calling back today to speak with a doctor.

## 2020-10-12 NOTE — Progress Notes (Signed)
Recreation Therapy Notes  Date:  11.26.21 Time: 0930 Location: [address] Group Room  Group Topic: Stress Management  Goal Area(s) Addresses:  Patient will identify positive stress management techniques. Patient will identify benefits of using stress management post d/c.  Intervention: Stress Management  Activity: Meditation.  LRT played a meditation that focused on making the most of your day and the possibilities that possibly await as the day goes on.    Education:  Stress Management, Discharge Planning.   Education Outcome: Acknowledges Education  Clinical Observations/Feedback: Pt did not attend group session.    Caroll Rancher, LRT/CTRS         Caroll Rancher A 10/12/2020 11:24 AM

## 2020-10-12 NOTE — Progress Notes (Signed)
   10/12/20 2200  Psych Admission Type (Psych Patients Only)  Admission Status Involuntary  Psychosocial Assessment  Patient Complaints None  Eye Contact Fair  Facial Expression Flat  Affect Anxious  Speech Pressured  Interaction Assertive  Motor Activity Other (Comment) (WNL)  Appearance/Hygiene Unremarkable  Behavior Characteristics Cooperative  Mood Anxious  Thought Process  Coherency WDL  Content WDL  Delusions None reported or observed  Perception WDL  Hallucination None reported or observed  Judgment WDL  Confusion None  Danger to Self  Current suicidal ideation? Denies  Danger to Others  Danger to Others None reported or observed

## 2020-10-13 DIAGNOSIS — F312 Bipolar disorder, current episode manic severe with psychotic features: Secondary | ICD-10-CM | POA: Diagnosis not present

## 2020-10-13 MED ORDER — DIVALPROEX SODIUM ER 250 MG PO TB24
750.0000 mg | ORAL_TABLET | Freq: Every day | ORAL | Status: DC
  Administered 2020-10-13 – 2020-10-16 (×4): 750 mg via ORAL
  Filled 2020-10-13 (×6): qty 3

## 2020-10-13 NOTE — Progress Notes (Signed)
   10/13/20 2100  Psych Admission Type (Psych Patients Only)  Admission Status Involuntary  Psychosocial Assessment  Patient Complaints Worrying;Anxiety  Eye Contact Fair  Facial Expression Flat  Affect Anxious  Speech Pressured  Interaction Assertive  Motor Activity Fidgety  Appearance/Hygiene Unremarkable  Behavior Characteristics Cooperative  Mood Anxious  Thought Process  Coherency WDL  Content WDL  Delusions None reported or observed  Perception WDL  Hallucination None reported or observed  Judgment WDL  Confusion None  Danger to Self  Current suicidal ideation? Denies  Danger to Others  Danger to Others None reported or observed

## 2020-10-13 NOTE — [facility] Group Notes (Signed)
LCSW Group Therapy Note  10/13/2020    10:00-11:00am   Type of Therapy and Topic:  Group Therapy: Early Messages Received About Anger  Participation Level:  Active   Description of Group:   In this group, patients shared and discussed the early messages received in their lives about anger through parental or other adult modeling, teaching, repression, punishment, violence, and more.  Participants identified how those childhood lessons influence even now how they usually or often react when angered.  The group discussed that anger is a secondary emotion and what may be the underlying emotional themes that come out through anger outbursts or that are ignored through anger suppression.    Therapeutic Goals: 1. Patients will identify one or more childhood message about anger that they received and how it was taught to them. 2. Patients will discuss how these childhood experiences have influenced and continue to influence their own expression or repression of anger even today. 3. Patients will explore possible primary emotions that tend to fuel their secondary emotion of anger. 4. Patients will learn that anger itself is normal and cannot be eliminated, and that healthier coping skills can assist with resolving conflict rather than worsening situations.  Summary of Patient Progress:  The patient shared that his childhood lessons about anger were from father who believed that "might is right" and was dominating.  As a result, he allowed himself to be "pummelled" emotionally and had poor boundaries.  The patient participated fully and demonstrated insight.  Therapeutic Modalities:   Cognitive Behavioral Therapy Motivation Interviewing  Lynnell Chad  .

## 2020-10-13 NOTE — Progress Notes (Signed)
[name] Progress Note  10/13/2020 12:30 PM Keith Guerrero  MRN:  [id number]  Subjective: "I do not feel that bipolar illness is the right diagnosis for me."  Objective: Patient is a 24 year old male with a past psychiatric history significant for probable bipolar disorder who presented to the [facility] emergency department on 10/03/2020 after a bizarre incident with police as well as apparently threatening to kill the [place] [employer] the [place] [employer] [place]. 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  Patient is seen, chart is reviewed.  Nurses deny any behavioral issues.  Patient has been attending groups with active participation.  On interview today, patient is calm pleasant and cooperative.  He is hyperverbal in relating his story from April 28, 2020 after receiving a Tdap vaccine following a dog bite.  Patient reports that he has experienced migraines and paresthesia of his arm since that time.  He does report that the symptoms are resolving, however he declines Depakote level drawn today due to flashbacks related to his vaccination when he sees needles.  Patient states that his symptoms began following the vaccination, however since that time he has been able to recollect to incidents dating back to 2019 with a marked crucifixion in an [place] literature class.  Patient relates conspiracy theories that he has discovered through his professors using Norse mythology, Luciferinism, Sanskrit, Landscape architect, and the [employer] calendar.  Patient describes that he wrote his paper for admissions into grad school on the subjects, and is proud of the fact that he was accepted into the past intelligence program.  Patient does admit that his sleep has decreased while he has been researching his theories.   Patient's thoughts are circumstantial, but ultimately he is able to answer questions after long detail.  He describes previously being on Trileptal and Seroquel which caused him to be oversedated.  He states that he currently is tolerating Zyprexa and Depakote at their current dosages and is agreeable to staying on these at this time.  In discussing his future, patient is uncertain that he wants to continue in grad school due to his concerns for nefarious agendas of the people leading these programs.  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 Patient feels generally frustrated that he made a comment that he quickly was remorseful for.  He denies any intent or that he ever had any actual plan to harm the [place] at [facility].  He recognizes that his paranoia got the better of him when he heard police cars.  He endorses a history of depression which he states was situational, and does not agree with the diagnosis of bipolar disorder despite having been described as bipolar on 3 separate occasions with 3 separate hospitalizations at which she was involuntarily committed.  Patient is agreeable to writing it out goals for his future education and career.  He is anxious about the residential treatment program, but this is discussed with him, and he is open to it.  I will provide literature for him as able.  I have also requested that pharmacy bring information regarding pharmacokinetics in biochemistry of Zyprexa and Depakote.    Principal Problem: Bipolar affective disorder, manic, severe, with psychotic behavior (HCC)  Diagnosis: Principal Problem:   Bipolar  affective disorder, manic, severe, with psychotic behavior (HCC)  Total Time spent with patient: 65 minutes  Past Psychiatric History: See H&P  Past Medical History:  Past Medical History:  Diagnosis Date  . Bipolar disorder (HCC)   . Pneumonia     Past Surgical History:   Procedure Laterality Date  . WISDOM TOOTH EXTRACTION     Family History:  Family History  Problem Relation Age of Onset  . Healthy Mother   . Healthy Father   . Hypertension Other    Family Psychiatric  History: See H&P  Social History:  Social History   Substance and Sexual Activity  Alcohol Use No     Social History   Substance and Sexual Activity  Drug Use No    Social History   Socioeconomic History  . Marital status: Single    Spouse name: Not on file  . Number of children: Not on file  . Years of education: Not on file  . Highest education level: Not on file  Occupational History  . Not on file  Tobacco Use  . Smoking status: Never Smoker  . Smokeless tobacco: Never Used  Vaping Use  . Vaping Use: Never used  Substance and Sexual Activity  . Alcohol use: No  . Drug use: No  . Sexual activity: Never  Other Topics Concern  . Not on file  Social History Narrative  . Not on file   Social Determinants of Health   Financial Resource Strain:   . Difficulty of Paying Living Expenses: Not on file  Food Insecurity:   . Worried About Programme researcher, broadcasting/film/videounning Out of Food in the Last Year: Not on file  . Ran Out of Food in the Last Year: Not on file  Transportation Needs:   . Lack of Transportation (Medical): Not on file  . Lack of Transportation (Non-Medical): Not on file  Physical Activity:   . Days of Exercise per Week: Not on file  . Minutes of Exercise per Session: Not on file  Stress:   . Feeling of Stress : Not on file  Social Connections:   . Frequency of Communication with Friends and Family: Not on file  . Frequency of Social Gatherings with Friends and Family: Not on file  . Attends Religious Services: Not on file  . Active Member of Clubs or Organizations: Not on file  . Attends BankerClub or Organization Meetings: Not on file  . Marital Status: Not on file   Additional Social History:   Sleep: Good  Appetite:  Good  Current Medications: Current  Facility-Administered Medications  Medication Dose Route Frequency Provider Last Rate Last Admin  . acetaminophen (TYLENOL) tablet 650 mg  650 mg Oral Q6H PRN Antonieta Pertlary, Greg Lawson, MD      . alum & mag hydroxide-simeth (MAALOX/MYLANTA) 200-200-20 MG/5ML suspension 30 mL  30 mL Oral Q4H PRN Antonieta Pertlary, Greg Lawson, MD      . divalproex (DEPAKOTE) DR tablet 750 mg  750 mg Oral QPM Laveda AbbeParks, Laurie Britton, NP   750 mg at 10/12/20 1759  . hydrOXYzine (ATARAX/VISTARIL) tablet 25 mg  25 mg Oral TID PRN Antonieta Pertlary, Greg Lawson, MD      . magnesium hydroxide (MILK OF MAGNESIA) suspension 30 mL  30 mL Oral Daily PRN Antonieta Pertlary, Greg Lawson, MD      . OLANZapine [facility](ZYPREXA) tablet 7.5 mg  7.5 mg Oral QHS Armandina StammerNwoko, Agnes I, NP   7.5 mg at 10/12/20 2103  . OLANZapine zydis (ZYPREXA) disintegrating tablet 5 mg  5 mg Oral Q8H PRN Antonieta Pert, MD      . traZODone (DESYREL) tablet 50 mg  50 mg Oral QHS PRN Antonieta Pert, MD        Lab Results: No results found for this or any previous visit (from the past 48 hour(s)).  Blood Alcohol level:  Lab Results  Component Value Date   ETH <10 10/03/2020   ETH <10 08/03/2020   Metabolic Disorder Labs: Lab Results  Component Value Date   HGBA1C 5.0 08/05/2020   MPG 96.8 08/05/2020   No results found for: PROLACTIN Lab Results  Component Value Date   CHOL 176 08/05/2020   TRIG 58 08/05/2020   HDL 59 08/05/2020   CHOLHDL 3.0 08/05/2020   VLDL 12 08/05/2020   LDLCALC 105 (H) 08/05/2020   Physical Findings: AIMS: Facial and Oral Movements Muscles of Facial Expression: None, normal Lips and Perioral Area: None, normal Jaw: None, normal Tongue: None, normal,Extremity Movements Upper (arms, wrists, hands, fingers): None, normal Lower (legs, knees, ankles, toes): None, normal, Trunk Movements Neck, shoulders, hips: None, normal, Overall Severity Severity of abnormal movements (highest score from questions above): None, normal Incapacitation due to abnormal movements:  None, normal Patient's awareness of abnormal movements (rate only patient's report): No Awareness, Dental Status Current problems with teeth and/or dentures?: No Does patient usually wear dentures?: No  CIWA:    COWS:     Musculoskeletal: Strength & Muscle Tone: within normal limits Gait & Station: normal Patient leans: N/A  Psychiatric Specialty Exam: Physical Exam Vitals and nursing note reviewed.  Constitutional:      Appearance: Normal appearance.  HENT:     Head: Normocephalic and atraumatic.     Nose: Nose normal.  Eyes:     Extraocular Movements: Extraocular movements intact.  Cardiovascular:     Rate and Rhythm: Normal rate.  Pulmonary:     Effort: Pulmonary effort is normal. No respiratory distress.  Musculoskeletal:        General: Normal range of motion.     Cervical back: Normal range of motion.  Neurological:     General: No focal deficit present.     Mental Status: He is alert and oriented to person, place, and time.     Review of Systems  Constitutional: Negative for chills, diaphoresis and fever.  HENT: Negative for congestion, rhinorrhea, sneezing and sore throat.   Eyes: Negative for discharge.  Respiratory: Negative for cough, chest tightness, shortness of breath and wheezing.   Cardiovascular: Negative for chest pain and palpitations.  Gastrointestinal: Negative for diarrhea, nausea and vomiting.  Endocrine: Negative for cold intolerance.  Genitourinary: Negative for difficulty urinating.  Musculoskeletal: Negative for arthralgias and myalgias.  Allergic/Immunologic:       Allergies: Fish  Neurological: Negative for dizziness, tremors, seizures, syncope, facial asymmetry, speech difficulty, weakness, light-headedness, numbness and headaches.  Psychiatric/Behavioral: Negative for agitation, behavioral problems, confusion, decreased concentration, dysphoric mood (Reports daily improvement)), hallucinations, self-injury, sleep disturbance and suicidal  ideas. The patient is not nervous/anxious and is not hyperactive.     Blood pressure 124/65, pulse 82, temperature 98.2 F (36.8 C), temperature source Oral, resp. rate 14, height 5\' 7"  (1.702 m), weight 68.9 kg, SpO2 100 %.Body mass index is 23.81 kg/m.  General Appearance: Casual and Neat  Eye Contact:  Good  Speech:  Clear and Coherent and Normal Rate  Volume:  Normal  Mood:  "Improving"  Affect:  Congruent  Thought Process:  Linear and Descriptions of Associations:  Intact  Orientation:  Full (Time, Place, and Person)  Thought Content:  Logical  Suicidal Thoughts:  Denies any thoughts, plans or intent  Homicidal Thoughts:  No  Memory:  Immediate;   Good Recent;   Good Remote;   Good  Judgement:  Fair  Insight:  Fair  Psychomotor Activity:  Normal  Concentration:  Concentration: Fair and Attention Span: Fair  Recall:  [employer] of Knowledge:  Fair  Language:  Good  Akathisia:  No  Handed:  Right  AIMS (if indicated):     Assets:  Desire for Improvement Financial Resources/Insurance Housing Physical Health Social Support Vocational/Educational  ADL's:  Intact  Cognition:  WNL  Sleep:  Number of Hours: 6   Treatment Plan Summary: Daily contact with patient to assess and evaluate symptoms and progress in treatment and Medication management   Continue inpatient hospitalization. Will continue today 10/13/2020 plan as below except where it is noted.  Plan: 1.  Continue  Depakote DR 750 mg p.o. every evening for mood dysregulation.  (Calculated dose for weight, and doses appropriate.  Will defer labs at this time) 2.    Continue Zyprexa 7.5 mg po qhs for mood control. 3.  Continue hydroxyzine 25 mg p.o. 3 times daily as needed for anxiety. 4.  Continue trazodone 50 mg p.o. nightly as needed for insomnia. 5.  Disposition planning-in progress.- to be discharged to the law enforcement-duty to warn. 6.  Depakote level to be drawn tomorrow 10-13-20-patient refused.  Mariel Craft, MD 10/13/2020, 12:30 PMPatient ID: [name], male   DOB: February 24, 1996, 24 y.o.   MRN: [id number] Patient ID: STETSON PELAEZ, male   DOB: [birth year], 24 y.o.   MRN: [id number]

## 2020-10-13 NOTE — Progress Notes (Signed)
Patient presents with an anxious mood. He denies SI/HI. He denies AVH. He denies depression and anxiety. He reports good sleep at bedtime. He refused morning labs and stated that he's afraid of needles from past trauma. Dr. Viviano Simas made aware. He reported that he would like to find a new medication that does not require blood monitoring.   Orders reviewed. Vital signs reviewed. Verbal support provided. 15 minute checks performed for safety.  Patient compliant with taking medications and denies any medication side effects.

## 2020-10-14 DIAGNOSIS — F312 Bipolar disorder, current episode manic severe with psychotic features: Secondary | ICD-10-CM | POA: Diagnosis not present

## 2020-10-14 NOTE — Progress Notes (Signed)
[facility] MD Progress Note  10/14/2020 5:51 PM Keith Guerrero  MRN:  [id number]  Subjective: " I have not been living a life that is true to myself, and have been living as an artificial persona."  Objective: Patient is a 24 year old male with a past psychiatric history significant for probable bipolar disorder who presented to the [facility] emergency department on 10/03/2020 after a bizarre incident with police as well as apparently threatening to kill the [place] of the [place] of [place]. 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  Patient is seen, chart is reviewed.  Nurses deny any behavioral issues.  Patient has been attending groups with active participation.  On interview today, patient is calm pleasant and cooperative.  He continues to have some periods where he is hyperverbal, however he is redirectable and able to focus on how to move forward with his life.  Patient is able to relate things that he enjoys doing (hiking, music, artery, fencing, horseback riding, baseball).  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  He relates how he has put pressure on himself to become the best classical musician or to follow in the footsteps of his maternal grandfather who went to [employer] and then became an Arts development officer in Armed forces training and education officer.  Patient is able to recognize that although he is in treatment with the field of  intelligence, that it "drags him down a rabbit hole to his own detriment."  He admits that he finds it difficult to stay away from these subjects.  Patient continues to endorse symptoms trust of professors at [employer], and discusses a hypothesis he has created related to the transgender movement.  Patient is able to state that he wishes to put all of the suicide at this time, and focus on his recovery.  He is willing to spend time in jail if that is what is needed, but also feels assured that his parents will be able to get him a sooner court date so that his charges can be determined.  Patient wants to take time to get his legal issues taking care of before proceeding to the next steps of his career.  Keith Guerrero endorses that he has struggled with depression in the past and despite his successes has had low self-esteem which she relates to not getting into the best schools for music and then later for academics.  He provides further detail of his first hospitalization which he believes was for depression related to life stress/difficult relationship.  He attempted to throw himself down the stairs in a suicide attempt.  Patient does not want to start an antidepressant at this time, while he awaits evaluating effectiveness of current mood stabilizing medications.  He was appreciative that the pharmacist brought him literature on the medications.  He is denying any suicidal or homicidal ideation.  He is denying any auditory or visual hallucinations.  He is able to state understanding that his beliefs  do sound delusional.  A family meeting is held with patient's parents on 10/14/2020: Patient is able to discuss above issues with his parents.  Parents feel that patient sounds more like his regular self.  They state that they are proud of him for making progress.  They are hopeful that he stays on medication and continues to work in outpatient treatment with a psychiatrist and a therapist.  They are given resources  regarding psychology today find a therapist in order to find someone that will work well for Keith Guerrero, noting that his past therapist agreed with his delusional thoughts.  Parents note that the residential psychiatry facilities require COVID-19 vaccination, so are honoring of Keith Guerrero not wanting to receive that vaccine.  They note that patient can go to a partial hospitalization program, and he is agreeable to starting that after discharge.  Parents are continuing to work with their lawyer as well as Patent examiner regarding [name]'s legal issues.  Parents agreed to pick patient up from the hospital.  They are hopeful that the police will not have to be involved at discharge.   Principal Problem: Bipolar affective disorder, manic, severe, with psychotic behavior (HCC)  Diagnosis: Principal Problem:   Bipolar affective disorder, manic, severe, with psychotic behavior (HCC)  Total Time spent with patient: 75 minutes  Past Psychiatric History: See H&P  Past Medical History:  Past Medical History:  Diagnosis Date  . Bipolar disorder (HCC)   . Pneumonia     Past Surgical History:  Procedure Laterality Date  . WISDOM TOOTH EXTRACTION     Family History:  Family History  Problem Relation Age of Onset  . Healthy Mother   . Healthy Father   . Hypertension Other    Family Psychiatric  History: See H&P  Social History:  Social History   Substance and Sexual Activity  Alcohol Use No     Social History   Substance and Sexual Activity  Drug Use No    Social History   Socioeconomic History  . Marital status: Single    Spouse name: Not on file  . Number of children: Not on file  . Years of education: Not on file  . Highest education level: Not on file  Occupational History  . Not on file  Tobacco Use  . Smoking status: Never Smoker  . Smokeless tobacco: Never Used  Vaping Use  . Vaping Use: Never used  Substance and Sexual Activity  . Alcohol use: No  . Drug use: No  . Sexual  activity: Never  Other Topics Concern  . Not on file  Social History Narrative  . Not on file   Social Determinants of Health   Financial Resource Strain:   . Difficulty of Paying Living Expenses: Not on file  Food Insecurity:   . Worried About Programme researcher, broadcasting/film/video in the Last Year: Not on file  . Ran Out of Food in the Last Year: Not on file  Transportation Needs:   . Lack of Transportation (Medical): Not on file  . Lack of Transportation (Non-Medical): Not on file  Physical Activity:   . Days of Exercise per Week: Not on file  . Minutes of Exercise per Session: Not on file  Stress:   . Feeling of Stress : Not on file  Social Connections:   . Frequency of Communication with Friends and Family: Not on file  . Frequency of Social Gatherings with Friends and Family: Not on file  . Attends Religious Services: Not  on file  . Active Member of Clubs or Organizations: Not on file  . Attends Banker Meetings: Not on file  . Marital Status: Not on file   Additional Social History:   Sleep: Good  Appetite:  Good  Current Medications: Current Facility-Administered Medications  Medication Dose Route Frequency Provider Last Rate Last Admin  . acetaminophen (TYLENOL) tablet 650 mg  650 mg Oral Q6H PRN Antonieta Pert, MD      . alum & mag hydroxide-simeth (MAALOX/MYLANTA) 200-200-20 MG/5ML suspension 30 mL  30 mL Oral Q4H PRN Antonieta Pert, MD      . divalproex (DEPAKOTE ER) 24 hr tablet 750 mg  750 mg Oral Daily Mariel Craft, MD   750 mg at 10/14/20 8299  . hydrOXYzine (ATARAX/VISTARIL) tablet 25 mg  25 mg Oral TID PRN Antonieta Pert, MD      . magnesium hydroxide (MILK OF MAGNESIA) suspension 30 mL  30 mL Oral Daily PRN Antonieta Pert, MD      . OLANZapine [facility]) tablet 7.5 mg  7.5 mg Oral QHS Armandina Stammer I, NP   7.5 mg at 10/13/20 2103  . OLANZapine zydis (ZYPREXA) disintegrating tablet 5 mg  5 mg Oral Q8H PRN Antonieta Pert, MD      .  traZODone (DESYREL) tablet 50 mg  50 mg Oral QHS PRN Antonieta Pert, MD        Lab Results: No results found for this or any previous visit (from the past 48 hour(s)).  Blood Alcohol level:  Lab Results  Component Value Date   ETH <10 10/03/2020   ETH <10 08/03/2020   Metabolic Disorder Labs: Lab Results  Component Value Date   HGBA1C 5.0 08/05/2020   MPG 96.8 08/05/2020   No results found for: PROLACTIN Lab Results  Component Value Date   CHOL 176 08/05/2020   TRIG 58 08/05/2020   HDL 59 08/05/2020   CHOLHDL 3.0 08/05/2020   VLDL 12 08/05/2020   LDLCALC 105 (H) 08/05/2020   Physical Findings: AIMS: Facial and Oral Movements Muscles of Facial Expression: None, normal Lips and Perioral Area: None, normal Jaw: None, normal Tongue: None, normal,Extremity Movements Upper (arms, wrists, hands, fingers): None, normal Lower (legs, knees, ankles, toes): None, normal, Trunk Movements Neck, shoulders, hips: None, normal, Overall Severity Severity of abnormal movements (highest score from questions above): None, normal Incapacitation due to abnormal movements: None, normal Patient's awareness of abnormal movements (rate only patient's report): No Awareness, Dental Status Current problems with teeth and/or dentures?: No Does patient usually wear dentures?: No  CIWA:    COWS:     Musculoskeletal: Strength & Muscle Tone: within normal limits Gait & Station: normal Patient leans: N/A  Psychiatric Specialty Exam: Physical Exam Vitals and nursing note reviewed.  Constitutional:      Appearance: Normal appearance.  HENT:     Head: Normocephalic and atraumatic.     Nose: Nose normal.  Eyes:     Extraocular Movements: Extraocular movements intact.  Cardiovascular:     Rate and Rhythm: Normal rate.  Pulmonary:     Effort: Pulmonary effort is normal. No respiratory distress.  Musculoskeletal:        General: Normal range of motion.     Cervical back: Normal range of  motion.  Neurological:     General: No focal deficit present.     Mental Status: He is alert and oriented to person, place, and time.  Review of Systems  Constitutional: Negative for chills, diaphoresis and fever.  HENT: Negative for congestion, rhinorrhea, sneezing and sore throat.   Eyes: Negative for discharge.  Respiratory: Negative for cough, chest tightness, shortness of breath and wheezing.   Cardiovascular: Negative for chest pain and palpitations.  Gastrointestinal: Negative for diarrhea, nausea and vomiting.  Endocrine: Negative for cold intolerance.  Genitourinary: Negative for difficulty urinating.  Musculoskeletal: Negative for arthralgias and myalgias.  Allergic/Immunologic:       Allergies: Fish  Neurological: Negative for dizziness, tremors, seizures, syncope, facial asymmetry, speech difficulty, weakness, light-headedness, numbness and headaches.  Psychiatric/Behavioral: Negative for agitation, behavioral problems, confusion, decreased concentration, dysphoric mood (Reports daily improvement)), hallucinations, self-injury, sleep disturbance and suicidal ideas. The patient is not nervous/anxious and is not hyperactive.     Blood pressure 136/86, pulse 61, temperature 97.9 F (36.6 C), temperature source Oral, resp. rate 14, height 5\' 7"  (1.702 m), weight 68.9 kg, SpO2 100 %.Body mass index is 23.81 kg/m.  General Appearance: Casual and Neat  Eye Contact:  Good  Speech:  Clear and Coherent and Normal Rate  Volume:  Normal  Mood:  Euthymic  Affect:  Congruent  Thought Process:  Linear and Descriptions of Associations: Intact  Orientation:  Full (Time, Place, and Person)  Thought Content:  Logical  Suicidal Thoughts:  Denies any thoughts, plans or intent  Homicidal Thoughts:  No  Memory:  Immediate;   Good Recent;   Good Remote;   Good  Judgement:  Fair  Insight:  Fair  Psychomotor Activity:  Normal  Concentration:  Concentration: Fair and Attention Span: Fair   Recall:  [employer]Fair  Fund of Knowledge:  Fair  Language:  Good  Akathisia:  No  Handed:  Right  AIMS (if indicated):     Assets:  Communication Skills Desire for Improvement Financial Resources/Insurance Housing Physical Health Resilience Social Support Talents/Skills Transportation Vocational/Educational  ADL's:  Intact  Cognition:  WNL  Sleep:  Number of Hours: 6.75   Treatment Plan Summary: Daily contact with patient to assess and evaluate symptoms and progress in treatment and Medication management   Continue inpatient hospitalization. Will continue today 10/14/2020 plan as below:  Plan: 1.  Continue  Depakote DR 750 mg p.o. every evening for mood dysregulation.  (Calculated dose for weight, and doses appropriate.  Will defer labs at this time) 2.  Continue Zyprexa 7.5 mg po qhs for mood control. 3.  Continue hydroxyzine 25 mg p.o. 3 times daily as needed for anxiety. 4.  Continue trazodone 50 mg p.o. nightly as needed for insomnia. 5.  Disposition planning-in progress.- to be discharged to the law enforcement-duty to warn.    Mariel CraftSHEILA M Deaaron Fulghum, MD 10/14/2020, 5:51 PMPatient ID: Keith Guerrero, male   DOB: 12-05-1995, 24 y.o.   MRN: [id number] Patient ID: Keith Guerrero, male   DOB: 12-05-1995, 24 y.o.   MRN: [id number]

## 2020-10-14 NOTE — Progress Notes (Signed)
   10/14/20 0800  Psych Admission Type (Psych Patients Only)  Admission Status Involuntary  Psychosocial Assessment  Patient Complaints Anxiety  Eye Contact Fair  Facial Expression Anxious  Affect Anxious  Speech Logical/coherent  Interaction Assertive  Motor Activity Fidgety  Appearance/Hygiene Unremarkable  Behavior Characteristics Appropriate to situation  Mood Anxious  Thought Process  Coherency WDL  Content WDL  Delusions None reported or observed  Perception WDL  Hallucination None reported or observed  Judgment WDL  Confusion None  Danger to Self  Current suicidal ideation? Denies  Danger to Others  Danger to Others None reported or observed

## 2020-10-14 NOTE — [facility] Group Notes (Signed)
Adult Psychoeducational Group Not Date:  10/14/2020 Time:  0900-1045 Group Topic/Focus: PROGRESSIVE RELAXATION. A group where deep breathing is taught and tensing and relaxation muscle groups is used. Imagery is used as well.  Pts are asked to imagine 3 pillars that hold them up when they are not able to hold themselves up.  Participation Level:  Active  Participation Quality:  Appropriate  Affect:  Appropriate  Cognitive:  Oriented  Insight: Improving  Engagement in Group:  Engaged  Modes of Intervention:  Activity, Discussion, Education, and Support  Additional Comments:  Pt rates himself at a 7/10. After going around the room and teaching people to deep breathe, Pt got up and left the room. After the group writer approached the pt who recounted a time when he was at school and the instructor hypnotized the whole class and told they about Lizard creatures that were going to overtake the world. Sitting in the group room brought all that information flooding back into his head and "I just had to leaver"                                                                                                 Dione Housekeeper 10/14/2020`

## 2020-10-14 NOTE — [facility] Group Notes (Signed)
[facility] Group Notes: (Clinical Social Work)   10/14/2020      Type of Therapy:  Group Therapy   Participation Level:  Did Not Attend - was invited both individually by MHT and by overhead announcement, chose not to attend.   Cristen Bredeson Grossman-Orr, LCSW 10/14/2020, 12:51 PM    

## 2020-10-14 NOTE — Progress Notes (Signed)
Adult Psychoeducational Group Note  Date:  10/14/2020 Time:  10:44 PM  Group Topic/Focus:  Wrap-Up Group:   The focus of this group is to help patients review their daily goal of treatment and discuss progress on daily workbooks.  Participation Level:  Active  Participation Quality:  Appropriate  Affect:  Appropriate  Cognitive:  Appropriate  Insight: Appropriate  Engagement in Group:  Engaged  Modes of Intervention:  Education and Support  Additional Comments:  Patient attended and participated in group tonight. He reported that today was a good day for him. He had a good discussion with his doctor and he did enjoyed group.  Lita Mains [facility] 10/14/2020, 10:44 PM

## 2020-10-15 NOTE — Progress Notes (Signed)
   10/14/20 2030  COVID-19 Daily Checkoff  Have you had a fever (temp > 37.80C/100F)  in the past 24 hours?  No  COVID-19 EXPOSURE  Have you traveled outside the state in the past 14 days? No  Have you been in contact with someone with a confirmed diagnosis of COVID-19 or PUI in the past 14 days without wearing appropriate PPE? No  Have you been living in the same home as a person with confirmed diagnosis of COVID-19 or a PUI (household contact)? No  Have you been diagnosed with COVID-19? No

## 2020-10-15 NOTE — Progress Notes (Signed)
   10/14/20 2030  Psych Admission Type (Psych Patients Only)  Admission Status Involuntary  Psychosocial Assessment  Patient Complaints None  Eye Contact Fair  Facial Expression Anxious  Affect Appropriate to circumstance  Speech Logical/coherent  Interaction Assertive  Motor Activity Fidgety  Appearance/Hygiene Unremarkable  Behavior Characteristics Cooperative;Appropriate to situation;Calm  Mood Anxious;Pleasant  Thought Process  Coherency WDL  Content WDL  Delusions None reported or observed  Perception WDL  Hallucination None reported or observed  Judgment WDL  Confusion None  Danger to Self  Current suicidal ideation? Denies  Danger to Others  Danger to Others None reported or observed

## 2020-10-15 NOTE — B[facility] Counselor (Addendum)
CSW spoke with Keith Guerrero who states that she is waiting to hear back from the [place] about what Keith Guerrero's charges are and if they can get a meeting with the Magistrate to discuss the parents bringing Keith Guerrero to [facility] instead of the police picking him up at the hospital.  Keith Guerrero states that she believes Keith Guerrero has 6 warrants for his arrest.  During the conversation with Keith Guerrero her husband received a call from the [place] stating that he was tied up in court and that it would be this afternoon or tomorrow before he could find out what the charges were.  CSW informed Keith Guerrero that she would speak with her on Tuesday morning about what the Attorney plans to do moving forward. Keith Guerrero states that she is hoping to have things in place for [facility] before the end of the week. CSW also informed Keith Guerrero that Keith Guerrero has accepted Keith Guerrero if he is not take to jail when he is released.

## 2020-10-15 NOTE — Progress Notes (Signed)
Pt appears anxious and worried about his outcome after discharge. He said that he is going straight to the magistrate because seeking treatment on a forensics unit will take 2 weeks. He has been present in the dayroom, he attended group, and engaged in phone conversations. Pt denies SI/HI and AVH. Active listening, reassurance, and support provided. Medications administered as ordered by provider. Q 15 min safety checks continue. Pt's safety has been maintained.   10/15/20 2100  Psych Admission Type (Psych Patients Only)  Admission Status Involuntary  Psychosocial Assessment  Patient Complaints Anxiety;Worrying  Eye Contact Fair  Facial Expression Anxious  Affect Appropriate to circumstance  Speech Logical/coherent  Interaction Assertive  Motor Activity Other (Comment) (WDL)  Appearance/Hygiene Unremarkable  Behavior Characteristics Cooperative;Anxious;Calm  Mood Anxious;Pleasant  Thought Process  Coherency WDL  Content WDL  Delusions None reported or observed  Perception WDL  Hallucination None reported or observed  Judgment WDL  Confusion None  Danger to Self  Current suicidal ideation? Denies  Danger to Others  Danger to Others None reported or observed

## 2020-10-15 NOTE — B[facility] Counselor (Signed)
CSW spoke with the doctor who stated that he would like to see if Keith Guerrero qualifies for a Microbiologist at [facility]  CSW contacted central regional who stated that the [name], [name], or [name] would need to file a 208 A or B and submit it for a Judge's signature before the evaluation could take place.  CSW spoke with Keith Guerrero about the evaluation and if it was something they would like to move forward with.  Keith Guerrero states that he does not want to move forward with the evaluation and believes that the "charges are bogus and a way to get money from people".  Keith Guerrero also states that "the Police Officer was stupid and she should not have been standing behind the car while [name] was in a manic state and she would not have gotten hit".  CSW reminded Keith Guerrero that the Police would need to be called before June is discharged and that Keith Guerrero was still an option for outpatient treatment if Keith Guerrero did not go to jail.  Keith Guerrero states that she is happy Keith Guerrero is a choice and that she believes that Keith Guerrero needs an inpatient facility that does not require the vaccine or that he be voluntary.  Keith Guerrero states that she will call tomorrow and inform the CSW what the [name] says about [name]'s release from the hospital.

## 2020-10-15 NOTE — Progress Notes (Signed)
   10/15/20 2100  COVID-19 Daily Checkoff  Have you had a fever (temp > 37.80C/100F)  in the past 24 hours?  No  COVID-19 EXPOSURE  Have you traveled outside the state in the past 14 days? No  Have you been in contact with someone with a confirmed diagnosis of COVID-19 or PUI in the past 14 days without wearing appropriate PPE? No  Have you been living in the same home as a person with confirmed diagnosis of COVID-19 or a PUI (household contact)? No  Have you been diagnosed with COVID-19? No

## 2020-10-15 NOTE — Progress Notes (Signed)
   10/15/20 1100  Psych Admission Type (Psych Patients Only)  Admission Status Involuntary  Psychosocial Assessment  Patient Complaints None  Eye Contact Fair  Facial Expression Other (Comment) (appropriate)  Affect Appropriate to circumstance  Speech Logical/coherent  Interaction Assertive  Motor Activity Other (Comment) (appropriate)  Appearance/Hygiene Unremarkable  Behavior Characteristics Cooperative  Mood Pleasant  Thought Process  Coherency WDL  Content WDL  Delusions None reported or observed  Perception WDL  Hallucination None reported or observed  Judgment WDL  Confusion None  Danger to Self  Current suicidal ideation? Denies  Danger to Others  Danger to Others None reported or observed  Dar Note: Patient presents with a blunted affect and mood.  Denies suicidal thoughts, auditory and visual hallucinations.  Medication given as prescribed.  Routine safety checks maintained.  Patient attended group and participated.  Visible in milieu interacting with peers.  Patient is safe on and off the unit.

## 2020-10-15 NOTE — Progress Notes (Signed)
[facility] MD Progress Note  10/15/2020 1:34 PM Keith Guerrero  MRN:  [id number] Subjective:  Patient is a 24 year old male with a past psychiatric history significant for probable bipolar disorder who presented to the [facility] emergency department on 10/03/2020 after a bizarre incident with police as well as apparently threatening to kill the [place] of the [place].  Objective: Patient is seen and examined.  Patient is a 24 year old male with the above-stated past psychiatric history who is seen in follow-up.  He denies complaint today.  He did admit to increased anxiety.  Apparently he has 4 felony charges, and most likely they will not be able to be alleviated.  It sounds like after discharge she will be arrested.  We discussed that today.  I also discussed with social work as well as the patient the possibility of trying to get the patient transferred to the forensics unit for her catchment area.  I think a forensics evaluation would be valuable to him.  He denied any side effects to his current medications.  He denied any suicidal or homicidal ideation.  He is anxious.  He denied racing thoughts.  His pressured speech is decreased from where it was when I last saw the patient a week ago.  His blood pressure this morning was 126/80.  He is afebrile.  He slept 6.75 hours.  His current medications include Depakote extended release 750 mg p.o. daily, hydroxyzine, olanzapine 7.5 mg p.o. nightly.  Principal Problem: Bipolar affective disorder, manic, severe, with psychotic behavior (HCC) Diagnosis: Principal Problem:   Bipolar affective disorder, manic, severe, with psychotic behavior (HCC)  Total Time spent with patient: 20 minutes  Past Psychiatric History: See admission H&P  Past Medical History:  Past Medical History:  Diagnosis Date   Bipolar disorder (HCC)    Pneumonia     Past Surgical History:  Procedure Laterality Date   WISDOM TOOTH  EXTRACTION     Family History:  Family History  Problem Relation Age of Onset   Healthy Mother    Healthy Father    Hypertension Other    Family Psychiatric  History: See admission H&P Social History:  Social History   Substance and Sexual Activity  Alcohol Use No     Social History   Substance and Sexual Activity  Drug Use No    Social History   Socioeconomic History   Marital status: Single    Spouse name: Not on file   Number of children: Not on file   Years of education: Not on file   Highest education level: Not on file  Occupational History   Not on file  Tobacco Use   Smoking status: Never Smoker   Smokeless tobacco: Never Used  Vaping Use   Vaping Use: Never used  Substance and Sexual Activity   Alcohol use: No   Drug use: No   Sexual activity: Never  Other Topics Concern   Not on file  Social History Narrative   Not on file   Social Determinants of Health   Financial Resource Strain:    Difficulty of Paying Living Expenses: Not on file  Food Insecurity:    Worried About Programme researcher, broadcasting/film/video in the Last Year: Not on file   [employer] of Food in the Last Year: Not on file  Transportation Needs:    Lack of Transportation (Medical): Not on file   Lack of Transportation (Non-Medical): Not on file  Physical Activity:  Days of Exercise per Week: Not on file   Minutes of Exercise per Session: Not on file  Stress:    Feeling of Stress : Not on file  Social Connections:    Frequency of Communication with Friends and Family: Not on file   Frequency of Social Gatherings with Friends and Family: Not on file   Attends Religious Services: Not on file   Active Member of Clubs or Organizations: Not on file   Attends Banker Meetings: Not on file   Marital Status: Not on file   Additional Social History:                         Sleep: Good  Appetite:  Fair  Current Medications: Current  Facility-Administered Medications  Medication Dose Route Frequency Provider Last Rate Last Admin   acetaminophen (TYLENOL) tablet 650 mg  650 mg Oral Q6H PRN Antonieta Pert, MD       alum & mag hydroxide-simeth (MAALOX/MYLANTA) 200-200-20 MG/5ML suspension 30 mL  30 mL Oral Q4H PRN Antonieta Pert, MD       divalproex (DEPAKOTE ER) 24 hr tablet 750 mg  750 mg Oral Daily Mariel Craft, MD   750 mg at 10/15/20 0805   hydrOXYzine (ATARAX/VISTARIL) tablet 25 mg  25 mg Oral TID PRN Antonieta Pert, MD       magnesium hydroxide (MILK OF MAGNESIA) suspension 30 mL  30 mL Oral Daily PRN Antonieta Pert, MD       OLANZapine [facility]) tablet 7.5 mg  7.5 mg Oral QHS Nwoko, Agnes I, NP   7.5 mg at 10/14/20 2100   OLANZapine zydis (ZYPREXA) disintegrating tablet 5 mg  5 mg Oral Q8H PRN Antonieta Pert, MD       traZODone (DESYREL) tablet 50 mg  50 mg Oral QHS PRN Antonieta Pert, MD        Lab Results: No results found for this or any previous visit (from the past 48 hour(s)).  Blood Alcohol level:  Lab Results  Component Value Date   ETH <10 10/03/2020   ETH <10 08/03/2020    Metabolic Disorder Labs: Lab Results  Component Value Date   HGBA1C 5.0 08/05/2020   MPG 96.8 08/05/2020   No results found for: PROLACTIN Lab Results  Component Value Date   CHOL 176 08/05/2020   TRIG 58 08/05/2020   HDL 59 08/05/2020   CHOLHDL 3.0 08/05/2020   VLDL 12 08/05/2020   LDLCALC 105 (H) 08/05/2020    Physical Findings: AIMS: Facial and Oral Movements Muscles of Facial Expression: None, normal Lips and Perioral Area: None, normal Jaw: None, normal Tongue: None, normal,Extremity Movements Upper (arms, wrists, hands, fingers): None, normal Lower (legs, knees, ankles, toes): None, normal, Trunk Movements Neck, shoulders, hips: None, normal, Overall Severity Severity of abnormal movements (highest score from questions above): None, normal Incapacitation due to abnormal  movements: None, normal Patient's awareness of abnormal movements (rate only patient's report): No Awareness, Dental Status Current problems with teeth and/or dentures?: No Does patient usually wear dentures?: No  CIWA:    COWS:     Musculoskeletal: Strength & Muscle Tone: within normal limits Gait & Station: normal Patient leans: N/A  Psychiatric Specialty Exam: Physical Exam Vitals and nursing note reviewed.  Constitutional:      Appearance: Normal appearance.  HENT:     Head: Normocephalic and atraumatic.  Pulmonary:     Effort: Pulmonary effort  is normal.  Neurological:     General: No focal deficit present.     Mental Status: He is alert and oriented to person, place, and time.     Review of Systems  Blood pressure (!) 149/67, pulse 71, temperature 97.8 F (36.6 C), temperature source Oral, resp. rate 14, height 5\' 7"  (1.702 m), weight 68.9 kg, SpO2 100 %.Body mass index is 23.81 kg/m.  General Appearance: Casual  Eye Contact:  Fair  Speech:  Normal Rate  Volume:  Increased  Mood:  Anxious  Affect:  Congruent  Thought Process:  Coherent and Descriptions of Associations: Intact  Orientation:  Full (Time, Place, and Person)  Thought Content:  Rumination  Suicidal Thoughts:  No  Homicidal Thoughts:  No  Memory:  Immediate;   Fair Recent;   Fair Remote;   Fair  Judgement:  Intact  Insight:  Fair  Psychomotor Activity:  Increased  Concentration:  Concentration: Fair and Attention Span: Fair  Recall:  of Knowledge:  Fair  Language:  Good  Akathisia:  Negative  Handed:  Right  AIMS (if indicated):     Assets:  Desire for Improvement Housing Resilience  ADL's:  Intact  Cognition:  WNL  Sleep:  Number of Hours: 6.75     Treatment Plan Summary: Daily contact with patient to assess and evaluate symptoms and progress in treatment, Medication management and Plan : Patient is seen and examined.  Patient is a 24 year old male with the above-stated past  psychiatric history who is seen in follow-up.   Diagnosis: 1.  Bipolar disorder, most recently manic, severe with psychotic features  Pertinent findings on examination today: 1.  Patient is anxious over the legal issues that surround his case. 2.  Sleep is improved. 3.  Mania, pressured speech and tangentiality is decreased. 4.  No suicidal or homicidal ideation. 5.  No reported side effects of medications.  Plan: 1.  Continue Depakote ER 750 mg p.o. daily for mood stability. 2.  Continue hydroxyzine 25 mg p.o. 3 times daily as needed anxiety. 3.  Continue Zyprexa 7.5 mg p.o. nightly for mood stability and sleep. 4.  Continue trazodone 50 mg p.o. nightly as needed insomnia. 5.  Await information from social work with regard to possible transfer to a forensics unit in the state. 6.  Disposition planning-in progress. 30, MD 10/15/2020, 1:34 PM

## 2020-10-16 MED ORDER — TRAZODONE HCL 50 MG PO TABS: 50.0000 mg | ORAL_TABLET | Freq: Every evening | ORAL | 0 refills | Status: AC | PRN

## 2020-10-16 MED ORDER — OLANZAPINE 7.5 MG PO TABS: 7.5000 mg | ORAL_TABLET | Freq: Every day | ORAL | 0 refills | Status: AC

## 2020-10-16 MED ORDER — DIVALPROEX SODIUM ER 250 MG PO TB24: 750.0000 mg | ORAL_TABLET | Freq: Every day | ORAL | 0 refills | Status: AC

## 2020-10-16 MED ORDER — HYDROXYZINE HCL 25 MG PO TABS: 25.0000 mg | ORAL_TABLET | Freq: Three times a day (TID) | ORAL | 0 refills | Status: AC | PRN

## 2020-10-16 NOTE — Discharge Summary (Addendum)
Physician Discharge Summary Note  Patient:  Keith Guerrero is an 24 y.o., male MRN:  [id number] DOB:  1996/02/14 Patient phone:  [phone] (home)  Patient address:   [address] [place] [address],  Total Time spent with patient: 30 minutes  Date of Admission:  10/05/2020 Date of Discharge: 10/16/2020  Reason for Admission:  Keith Guerrero is a 89 year old Caucasian male with a past psychiatric history significant for probable bipolar disorder who presented to the [facility] emergency department after a bizarre incident with police as well as an apparent threat to kill the [address] of the [place]. He had one previous admission to [facility] on 08/04/2020. He was evaluated at that time and noted to be manic, unfortunately he refused medications , was not suicidal, homicidal or psychotic and he did not meet criteria for forced medications and he was discharged home.   During his initial assessment for this current admission (from initial H&P):  Patient stated that he did follow up with therapy after his September discharge and felt it was helpful. He also stated he had not seen his therapist in a while, had gone to [employer] to visit a friend and drank one beer, which he stated either was to much for him or had something in it. Then the bizarre events, as noted above, including threats to do harm to the [facility], led to his worsening paranoia which then led to a car chase after an involuntary commitment for homicidal ideation. He was observed to have pressured speech with tangential thought process. He denied suicidality or homicidality. He had some understanding of the seriousness of the events leading up to his hospitalization. He was admitted to the hospital for agreed to take medication.    During the course of this hospitalization, he has been medication compliant and has attended group therapy. He has been calm and  cooperative and has had no behavioral issues on the unit. He has increased awareness of the seriousness of the events that led to his hospital admission. He is aware he has pending legal charges for his actions. He denies suicidal or homicidal ideation, plan or intent to act on any plan. He stated he has been sleeping well and his appetite is good. His parents have been involved in his care and have obtained emergency guardianship in order to provide [facility] with aftercare follow after he is discharged.     Principal Problem: Bipolar affective disorder, manic, severe, with psychotic behavior (HCC) Discharge Diagnoses: Principal Problem:   Bipolar affective disorder, manic, severe, with psychotic behavior (HCC)   Past Psychiatric History: See H&P  Past Medical History:  Past Medical History:  Diagnosis Date   Bipolar disorder (HCC)    Pneumonia     Past Surgical History:  Procedure Laterality Date   WISDOM TOOTH EXTRACTION     Family History:  Family History  Problem Relation Age of Onset   Healthy Mother    Healthy Father    Hypertension Other    Family Psychiatric  History: See H&P Social History:  Social History   Substance and Sexual Activity  Alcohol Use No     Social History   Substance and Sexual Activity  Drug Use No    Social History   Socioeconomic History   Marital status: Single    Spouse name: Not on file   Number of children: Not on file   Years of education: Not on file  Highest education level: Not on file  Occupational History   Not on file  Tobacco Use   Smoking status: Never Smoker   Smokeless tobacco: Never Used  Vaping Use   Vaping Use: Never used  Substance and Sexual Activity   Alcohol use: No   Drug use: No   Sexual activity: Never  Other Topics Concern   Not on file  Social History Narrative   Not on file   Social Determinants of Health   Financial Resource Strain:    Difficulty of Paying Living Expenses:  Not on file  Food Insecurity:    Worried About Running Out of Food in the Last Year: Not on file   Ran Out of Food in the Last Year: Not on file  Transportation Needs:    Lack of Transportation (Medical): Not on file   Lack of Transportation (Non-Medical): Not on file  Physical Activity:    Days of Exercise per Week: Not on file   Minutes of Exercise per Session: Not on file  Stress:    Feeling of Stress : Not on file  Social Connections:    Frequency of Communication with Friends and Family: Not on file   Frequency of Social Gatherings with Friends and Family: Not on file   Attends Religious Services: Not on file   Active Member of Clubs or Organizations: Not on file   Attends Banker Meetings: Not on file   Marital Status: Not on file    Hospital Course:  He remained on the [facility] unit for 10 days. He was started on Zyprexa and Depakote for mood stabilization. He participated in group therapy on the unit. He responded well to treatment with no adverse effects reported. He has shown improved mood, affect, sleep, and interaction. He denies any SI/HI/AVH and contracts for safety. He is discharging on the medications listed below. He agrees to follow up at [facility] and [facility], information placed in AVS. Patient is provided with prescriptions for medications upon discharge. [name] is being picked up by police for discharge to [employer] office.   Physical Findings: AIMS: Facial and Oral Movements Muscles of Facial Expression: None, normal Lips and Perioral Area: None, normal Jaw: None, normal Tongue: None, normal,Extremity Movements Upper (arms, wrists, hands, fingers): None, normal Lower (legs, knees, ankles, toes): None, normal, Trunk Movements Neck, shoulders, hips: None, normal, Overall Severity Severity of abnormal movements (highest score from questions above): None, normal Incapacitation due to abnormal movements:  None, normal Patient's awareness of abnormal movements (rate only patient's report): No Awareness, Dental Status Current problems with teeth and/or dentures?: No Does patient usually wear dentures?: No  CIWA:    COWS:     Musculoskeletal: Strength & Muscle Tone: within normal limits Gait & Station: normal Patient leans: N/A  Psychiatric Specialty Exam: Physical Exam  Review of Systems  Constitutional: Negative for activity change and appetite change.  Respiratory: Negative for chest tightness and shortness of breath.   Cardiovascular: Negative for chest pain.  Gastrointestinal: Negative for abdominal pain.  Neurological: Negative for facial asymmetry and headaches.     Blood pressure 129/74, pulse 95, temperature 97.8 F (36.6 C), temperature source Oral, resp. rate 14, height 5\' 7"  (1.702 m), weight 68.9 kg, SpO2 99 %.Body mass index is 23.81 kg/m.  General Appearance: Casual and Fairly Groomed  Eye Contact:  Good  Speech:  Normal Rate  Volume:  Normal  Mood:  Anxious  Affect:  Appropriate and Congruent  Thought Process:  Coherent and Descriptions of Associations: Intact  Orientation:  Full (Time, Place, and Person)  Thought Content:  Logical  Suicidal Thoughts:  No  Homicidal Thoughts:  No  Memory:  Immediate;   Good Recent;   Good Remote;   Good  Judgement:  Intact  Insight:  Fair  Psychomotor Activity:  Normal  Concentration:  Concentration: Fair and Attention Span: Fair  Recall:  Good  Fund of Knowledge:  Good  Language:  Good  Akathisia:  Negative  Handed:  Right  AIMS (if indicated):     Assets:  Communication Skills Desire for Improvement Financial Resources/Insurance Housing Resilience Social Support Talents/Skills Transportation Vocational/Educational  ADL's:  Intact  Cognition:  WNL  Sleep:  Number of Hours: 6.5     Have you used any form of tobacco in the last 30 days? (Cigarettes, Smokeless Tobacco, Cigars, and/or Pipes): No  Has this  patient used any form of tobacco in the last 30 days? (Cigarettes, Smokeless Tobacco, Cigars, and/or Pipes) No  Blood Alcohol level:  Lab Results  Component Value Date   ETH <10 10/03/2020   ETH <10 08/03/2020    Metabolic Disorder Labs:  Lab Results  Component Value Date   HGBA1C 5.0 08/05/2020   MPG 96.8 08/05/2020   No results found for: PROLACTIN Lab Results  Component Value Date   CHOL 176 08/05/2020   TRIG 58 08/05/2020   HDL 59 08/05/2020   CHOLHDL 3.0 08/05/2020   VLDL 12 08/05/2020   LDLCALC 105 (H) 08/05/2020    See Psychiatric Specialty Exam and Suicide Risk Assessment completed by Attending Physician prior to discharge.  Discharge [place]:  [place]  [facility] [facility] [employer] [facility]  Is patient on multiple antipsychotic therapies at discharge:  No   Has Patient had three or more failed trials of antipsychotic monotherapy by history:  No  Recommended Plan for Multiple Antipsychotic Therapies: NA  Discharge Instructions    Diet - low sodium heart healthy   Complete by: As directed    Increase activity slowly   Complete by: As directed      Allergies as of 10/16/2020      Reactions   Fish Allergy Hives   Other Hives, Itching   "Sea Bass"      Medication List    STOP taking these medications   OXcarbazepine 150 MG tablet Commonly known as: TRILEPTAL     TAKE these medications     Indication  divalproex 250 MG 24 hr tablet Commonly known as: DEPAKOTE ER Take 3 tablets (750 mg total) by mouth daily. Start taking on: October 17, 2020  Indication: Manic Phase of Manic-Depression   hydrOXYzine 25 MG tablet Commonly known as: ATARAX/VISTARIL Take 1 tablet (25 mg total) by mouth 3 (three) times daily as needed for anxiety.  Indication: Feeling Anxious   OLANZapine 7.5 MG tablet Commonly known as: ZYPREXA Take 1 tablet (7.5 mg total) by mouth at bedtime.  Indication: Hypomanic Episode of Bipolar Disorder   traZODone 50 MG  tablet Commonly known as: DESYREL Take 1 tablet (50 mg total) by mouth at bedtime as needed for sleep.  Indication: Trouble Sleeping       Follow-up Information    [facility] Follow up.   Why: with Carlynn SpryJames Pearce Contact information: [address] [place] [address]  Phone: [phone]       [facility].   Why: with Autumn PattyJohn Russo Contact information: [address] [facility] [place] [address] [phone]              Follow-up recommendations:  Activity:  as tolerated Diet:  Heart Healthy  Comments:  Patient is instructed prior to discharge to: Take all medications as prescribed by his/her mental healthcare provider. Report any adverse effects and or reactions from the medicines to his/her outpatient provider promptly. Patient has been instructed & cautioned: To not engage in alcohol and or illegal drug use while on prescription medicines. In the event of worsening symptoms, patient is instructed to call the crisis hotline, 911 and or go to the nearest ED for appropriate evaluation and treatment of symptoms. To follow-up with his/her primary care provider for your other medical issues, concerns and or health care needs.  Signed: Laveda Abbe, NP 10/16/2020, 8:50 AM   This is an addendum to the discharge summary.  This addendum was dictated on 10/17/2020.  The patient's father contacted me with regard to the patient's evaluation on admission regarding homicidality.  Part of the legal process was that during his manic phase he had mentioned to someone about "killing" the [facility] with regard to how it impacted his academic studies.  Throughout the entire course of the hospitalization, and documented in my mental status examination on the times that I examined the patient he was never homicidal, never threatened anyone nor himself.  It was felt that most of those threats were  secondary to his mania and agitation at that time.  It was probably because of some degree of lability.  I do not in any way at the time of discharge believe that the patient is homicidal or suicidal or risk to anyone at this point.

## 2020-10-16 NOTE — [facility]HH Suicide Risk Assessment (Signed)
[facility] Discharge Suicide Risk Assessment   Principal Problem: Bipolar affective disorder, manic, severe, with psychotic behavior (HCC) Discharge Diagnoses: Principal Problem:   Bipolar affective disorder, manic, severe, with psychotic behavior (HCC)   Total Time spent with patient: 20 minutes  Musculoskeletal: Strength & Muscle Tone: within normal limits Gait & Station: normal Patient leans: N/A  Psychiatric Specialty Exam: Review of Systems  All other systems reviewed and are negative.   Blood pressure 129/74, pulse 95, temperature 97.8 F (36.6 C), temperature source Oral, resp. rate 14, height 5\' 7"  (1.702 m), weight 68.9 kg, SpO2 99 %.Body mass index is 23.81 kg/m.  General Appearance: Casual  Eye Contact::  Good  Speech:  Normal Rate409  Volume:  Normal  Mood:  Anxious  Affect:  Congruent  Thought Process:  Coherent and Descriptions of Associations: Intact  Orientation:  Full (Time, Place, and Person)  Thought Content:  Logical  Suicidal Thoughts:  No  Homicidal Thoughts:  No  Memory:  Immediate;   Good Recent;   Good Remote;   Good  Judgement:  Intact  Insight:  Fair  Psychomotor Activity:  Increased  Concentration:  Fair  Recall:  Fair  Fund of Knowledge:Good  Language: Good  Akathisia:  Negative  Handed:  Right  AIMS (if indicated):     Assets:  Communication Skills Desire for Improvement Financial Resources/Insurance Housing Resilience Social Support Talents/Skills Transportation  Sleep:  Number of Hours: 6.5  Cognition: WNL  ADL's:  Intact   Mental Status Per Nursing Assessment::   On Admission:  NA  Demographic Factors:  Male, Caucasian and Unemployed  Loss Factors: Legal issues  Historical Factors: Impulsivity  Risk Reduction Factors:   Living with another person, especially a relative and Positive social support  Continued Clinical Symptoms:  Bipolar Disorder:   Mixed State  Cognitive Features That Contribute To Risk:  None     Suicide Risk:  Minimal: No identifiable suicidal ideation.  Patients presenting with no risk factors but with morbid ruminations; may be classified as minimal risk based on the severity of the depressive symptoms   Follow-up Information    [facility] Follow up.   Why: with [phone] Contact information: [address] [place], [place]  Phone: [phone]       [facility] Follow up.   Why: with [facility] information: [address] [place] [phone]              Plan Of Care/Follow-up recommendations:  Activity:  ad lib  [phone], MD 10/16/2020, 7:49 AM
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
## 2020-10-16 NOTE — Progress Notes (Addendum)
RN reviewed pt's SRA, transition record, and AVS with pt.  Pt verbalized understanding of all 3 documents.  RN answered pt's questions and returned pt's belongings to pt.    See LCSWA's Melba Coon) note for more information regarding pt's discharge from [facility].

## 2020-10-16 NOTE — Progress Notes (Signed)
°  [facility] Adult Case Management Discharge Plan :  Will you be returning to the same living situation after discharge:  No. At discharge, do you have transportation home?: Yes,  Mother and father Do you have the ability to pay for your medications: Yes,  Insurance  Release of information consent forms completed and in the chart;  Patient's signature needed at discharge.  Patient to Follow up at:  Follow-up Information    [facility] Follow up.   Why: with Carlynn Spry Contact information: [address] [place], [place] [address]  Phone: [phone]       [facility] Follow up.   Why: with Autumn Patty information: [address] [place] [address] [phone]       [facility] Follow up on 10/16/2020.   Why: Appointment is scheduled for 2:00pm.   Contact information: Address: [address], [place], [place] [address]  Phone: [phone] Fax: [phone]               Next level of care provider has access to [facility] Link:no  Safety Planning and Suicide Prevention discussed: Yes,  mother, father and patient  Have you used any form of tobacco in the last 30 days? (Cigarettes, Smokeless Tobacco, Cigars, and/or Pipes): No  Has patient been referred to the Quitline?: Patient refused referral  Patient has been referred for addiction treatment: N/A  Aram Beecham, LCSWA 10/16/2020, 9:08 AM
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
## 2020-10-29 DIAGNOSIS — F209 Schizophrenia, unspecified: Secondary | ICD-10-CM | POA: Diagnosis not present

## 2020-11-02 DIAGNOSIS — F209 Schizophrenia, unspecified: Secondary | ICD-10-CM | POA: Diagnosis not present

## 2020-11-05 DIAGNOSIS — F209 Schizophrenia, unspecified: Secondary | ICD-10-CM | POA: Diagnosis not present

## 2020-11-07 DIAGNOSIS — F209 Schizophrenia, unspecified: Secondary | ICD-10-CM | POA: Diagnosis not present

## 2020-11-12 DIAGNOSIS — F209 Schizophrenia, unspecified: Secondary | ICD-10-CM | POA: Diagnosis not present

## 2020-11-15 DIAGNOSIS — F209 Schizophrenia, unspecified: Secondary | ICD-10-CM | POA: Diagnosis not present

## 2020-11-19 DIAGNOSIS — F250 Schizoaffective disorder, bipolar type: Secondary | ICD-10-CM | POA: Diagnosis not present

## 2020-11-27 DIAGNOSIS — F250 Schizoaffective disorder, bipolar type: Secondary | ICD-10-CM | POA: Diagnosis not present

## 2020-12-04 DIAGNOSIS — F250 Schizoaffective disorder, bipolar type: Secondary | ICD-10-CM | POA: Diagnosis not present

## 2020-12-06 DIAGNOSIS — F250 Schizoaffective disorder, bipolar type: Secondary | ICD-10-CM | POA: Diagnosis not present

## 2020-12-11 DIAGNOSIS — Z7189 Other specified counseling: Secondary | ICD-10-CM | POA: Diagnosis not present

## 2020-12-11 DIAGNOSIS — R000 Tachycardia, unspecified: Secondary | ICD-10-CM | POA: Diagnosis not present

## 2020-12-11 DIAGNOSIS — R509 Fever, unspecified: Secondary | ICD-10-CM | POA: Diagnosis not present

## 2020-12-11 DIAGNOSIS — F250 Schizoaffective disorder, bipolar type: Secondary | ICD-10-CM | POA: Diagnosis not present

## 2020-12-11 DIAGNOSIS — F22 Delusional disorders: Secondary | ICD-10-CM | POA: Diagnosis not present

## 2020-12-14 DIAGNOSIS — E559 Vitamin D deficiency, unspecified: Secondary | ICD-10-CM | POA: Diagnosis not present

## 2020-12-14 DIAGNOSIS — Z0000 Encounter for general adult medical examination without abnormal findings: Secondary | ICD-10-CM | POA: Diagnosis not present

## 2020-12-14 DIAGNOSIS — R002 Palpitations: Secondary | ICD-10-CM | POA: Diagnosis not present

## 2020-12-14 DIAGNOSIS — R509 Fever, unspecified: Secondary | ICD-10-CM | POA: Diagnosis not present

## 2020-12-18 DIAGNOSIS — F250 Schizoaffective disorder, bipolar type: Secondary | ICD-10-CM | POA: Diagnosis not present

## 2020-12-20 DIAGNOSIS — F250 Schizoaffective disorder, bipolar type: Secondary | ICD-10-CM | POA: Diagnosis not present

## 2020-12-27 DIAGNOSIS — F250 Schizoaffective disorder, bipolar type: Secondary | ICD-10-CM | POA: Diagnosis not present

## 2021-01-01 DIAGNOSIS — R059 Cough, unspecified: Secondary | ICD-10-CM | POA: Diagnosis not present

## 2021-01-08 DIAGNOSIS — F250 Schizoaffective disorder, bipolar type: Secondary | ICD-10-CM | POA: Diagnosis not present

## 2021-01-15 DIAGNOSIS — F250 Schizoaffective disorder, bipolar type: Secondary | ICD-10-CM | POA: Diagnosis not present

## 2021-01-22 DIAGNOSIS — F250 Schizoaffective disorder, bipolar type: Secondary | ICD-10-CM | POA: Diagnosis not present

## 2021-01-31 DIAGNOSIS — F250 Schizoaffective disorder, bipolar type: Secondary | ICD-10-CM | POA: Diagnosis not present

## 2021-02-07 DIAGNOSIS — F250 Schizoaffective disorder, bipolar type: Secondary | ICD-10-CM | POA: Diagnosis not present

## 2021-02-19 DIAGNOSIS — L304 Erythema intertrigo: Secondary | ICD-10-CM | POA: Diagnosis not present

## 2021-02-21 DIAGNOSIS — F250 Schizoaffective disorder, bipolar type: Secondary | ICD-10-CM | POA: Diagnosis not present

## 2021-03-20 DIAGNOSIS — F250 Schizoaffective disorder, bipolar type: Secondary | ICD-10-CM | POA: Diagnosis not present

## 2021-03-21 DIAGNOSIS — R198 Other specified symptoms and signs involving the digestive system and abdomen: Secondary | ICD-10-CM | POA: Diagnosis not present

## 2021-04-17 DIAGNOSIS — F250 Schizoaffective disorder, bipolar type: Secondary | ICD-10-CM | POA: Diagnosis not present

## 2021-05-21 DIAGNOSIS — R0789 Other chest pain: Secondary | ICD-10-CM | POA: Diagnosis not present

## 2021-05-21 DIAGNOSIS — R519 Headache, unspecified: Secondary | ICD-10-CM | POA: Diagnosis not present

## 2021-06-12 DIAGNOSIS — D2261 Melanocytic nevi of right upper limb, including shoulder: Secondary | ICD-10-CM | POA: Diagnosis not present

## 2021-06-12 DIAGNOSIS — D225 Melanocytic nevi of trunk: Secondary | ICD-10-CM | POA: Diagnosis not present

## 2021-06-12 DIAGNOSIS — D485 Neoplasm of uncertain behavior of skin: Secondary | ICD-10-CM | POA: Diagnosis not present

## 2021-06-27 DIAGNOSIS — F3112 Bipolar disorder, current episode manic without psychotic features, moderate: Secondary | ICD-10-CM | POA: Diagnosis not present

## 2021-07-15 DIAGNOSIS — F3112 Bipolar disorder, current episode manic without psychotic features, moderate: Secondary | ICD-10-CM | POA: Diagnosis not present

## 2021-07-25 ENCOUNTER — Other Ambulatory Visit (HOSPITAL_COMMUNITY): Payer: Self-pay | Admitting: Internal Medicine

## 2021-07-25 DIAGNOSIS — R002 Palpitations: Secondary | ICD-10-CM | POA: Diagnosis not present

## 2021-07-26 DIAGNOSIS — F331 Major depressive disorder, recurrent, moderate: Secondary | ICD-10-CM | POA: Diagnosis not present

## 2021-07-29 ENCOUNTER — Other Ambulatory Visit (HOSPITAL_BASED_OUTPATIENT_CLINIC_OR_DEPARTMENT_OTHER): Payer: Self-pay | Admitting: Internal Medicine

## 2021-07-29 DIAGNOSIS — R002 Palpitations: Secondary | ICD-10-CM

## 2021-07-30 ENCOUNTER — Ambulatory Visit (INDEPENDENT_AMBULATORY_CARE_PROVIDER_SITE_OTHER): Payer: BC Managed Care – PPO

## 2021-07-30 ENCOUNTER — Other Ambulatory Visit: Payer: Self-pay

## 2021-07-30 DIAGNOSIS — R002 Palpitations: Secondary | ICD-10-CM | POA: Diagnosis not present

## 2021-07-31 LAB — ECHOCARDIOGRAM COMPLETE
Area-P 1/2: 3.77 cm2
S' Lateral: 3.39 cm

## 2021-08-30 DIAGNOSIS — F3112 Bipolar disorder, current episode manic without psychotic features, moderate: Secondary | ICD-10-CM | POA: Diagnosis not present

## 2021-09-17 DIAGNOSIS — R1031 Right lower quadrant pain: Secondary | ICD-10-CM | POA: Diagnosis not present

## 2021-09-27 IMAGING — CT CT HEAD W/O CM
3 series · 15 of 47 positions shown, 18 images · non-contrast
Comparison: None.

CLINICAL DATA: Seizures.  Suicidal ideation.

EXAM:
CT HEAD WITHOUT CONTRAST
TECHNIQUE: Contiguous axial images were obtained from the base of the skull
through the vertex without intravenous contrast.

[Series 2: head wo · axial · 0.47mm/px · z∈[-116,+9]mm · 9 of 31 slices shown, 12 images]
[im 3/31  brain]
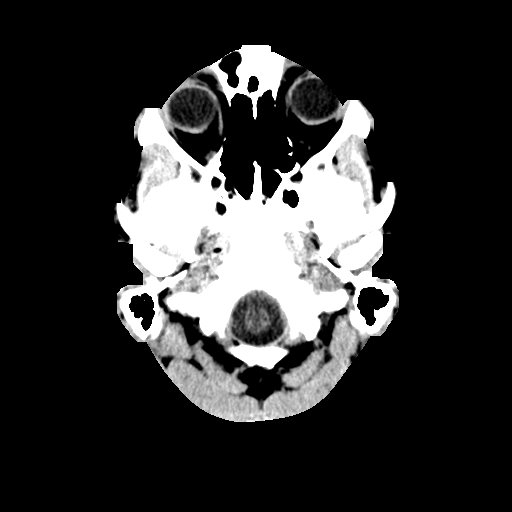
[im 3/31  bone]
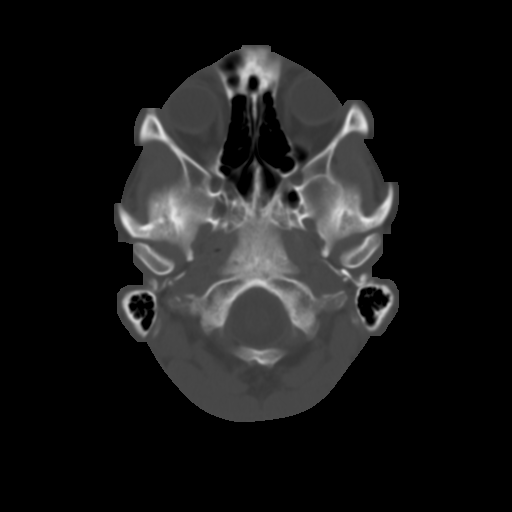
[im 6/31  brain]
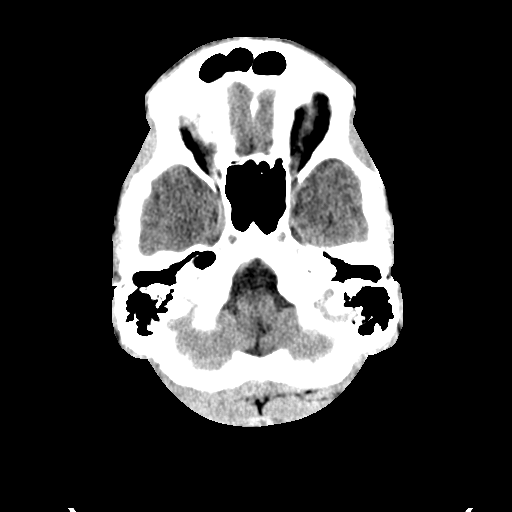
[im 9/31  brain]
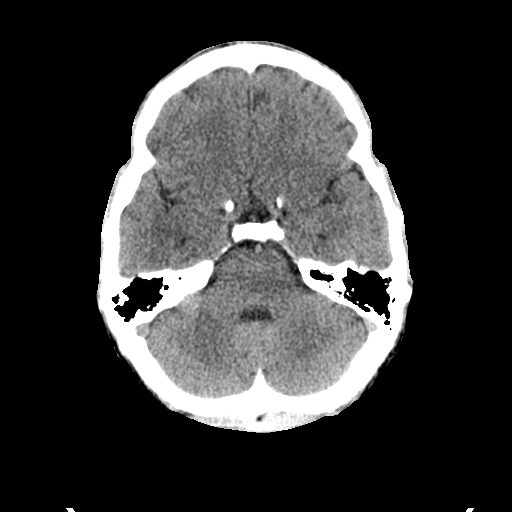
[im 12/31  brain]
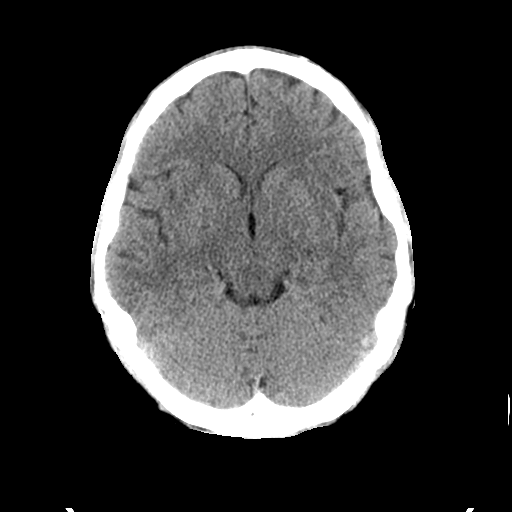
[im 16/31  brain]
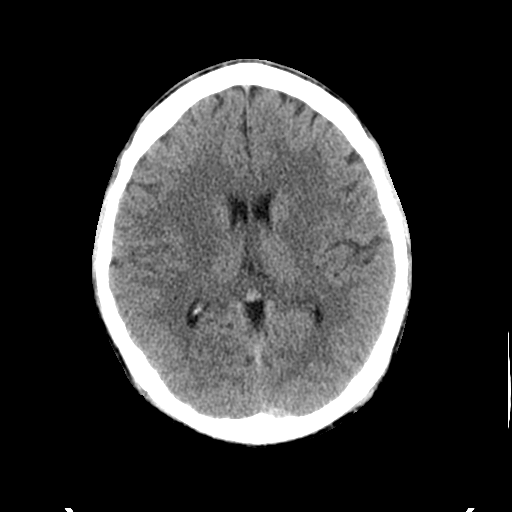
[im 16/31  bone]
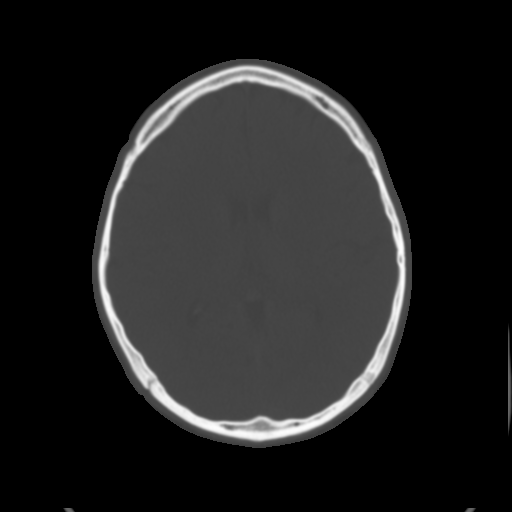
[im 19/31  brain]
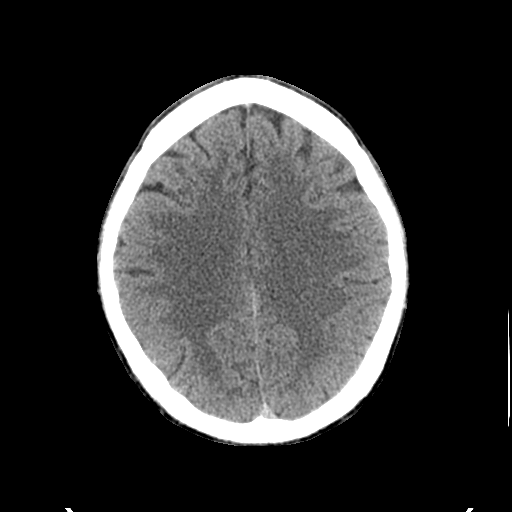
[im 22/31  brain]
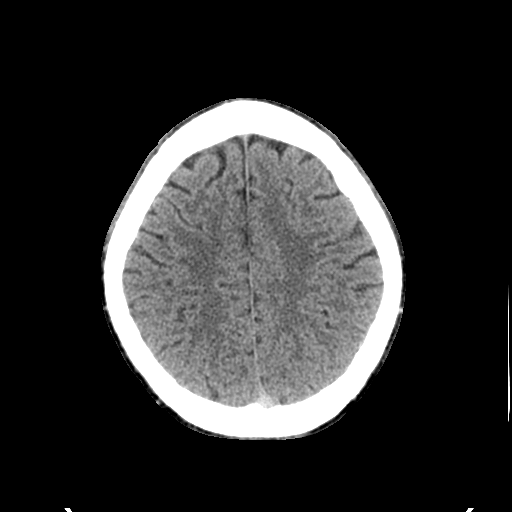
[im 25/31  brain]
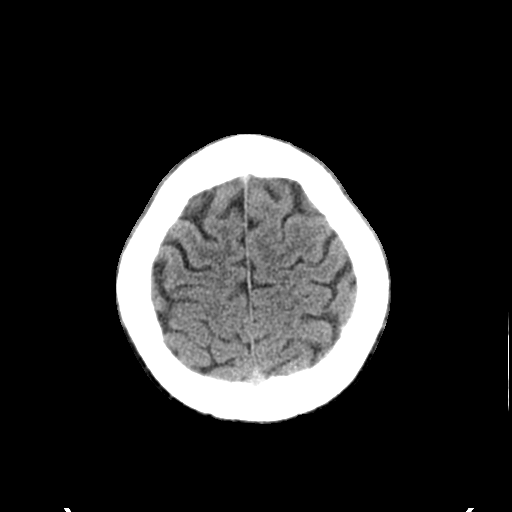
[im 28/31  brain]
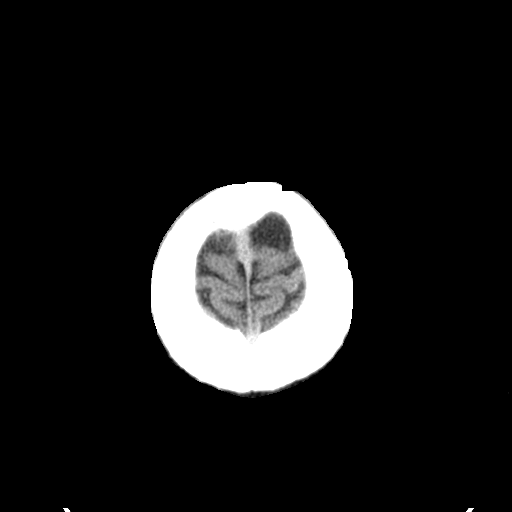
[im 28/31  bone]
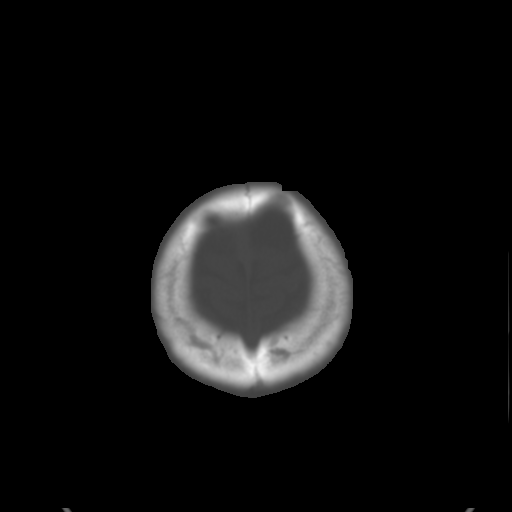

[Series 5: coronal soft tissue · coronal · 0.29mm/px · 3 of 69 slices shown]
[im 23/69  brain]
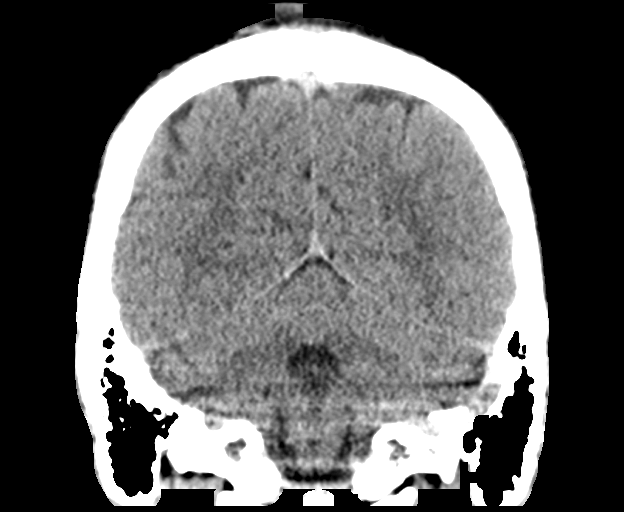
[im 31/69  brain]
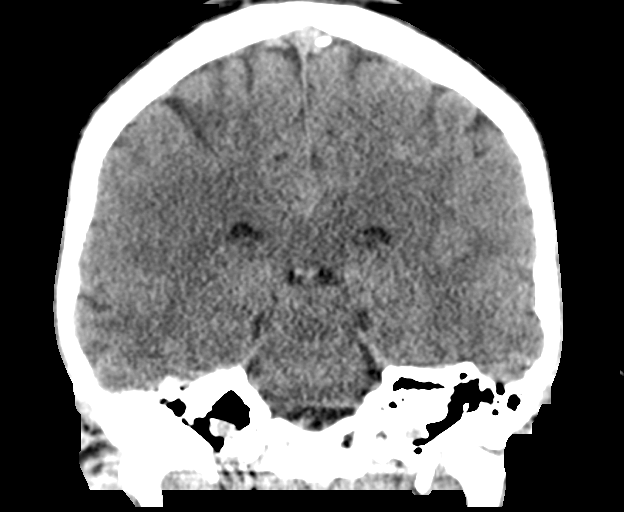
[im 38/69  brain]
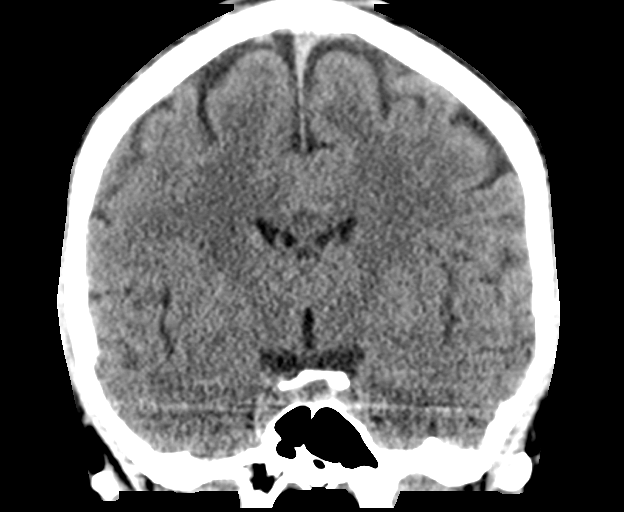

[Series 6: sagittal soft tissue · sagittal · 0.30mm/px · 3 of 62 slices shown]
[im 21/62  brain]
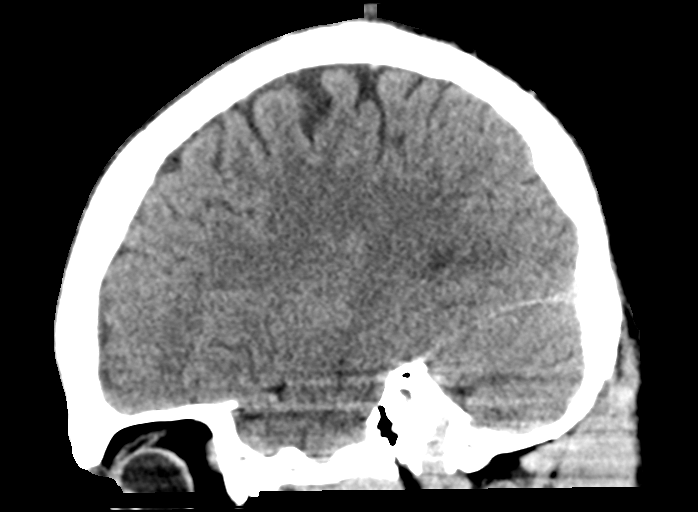
[im 31/62  brain]
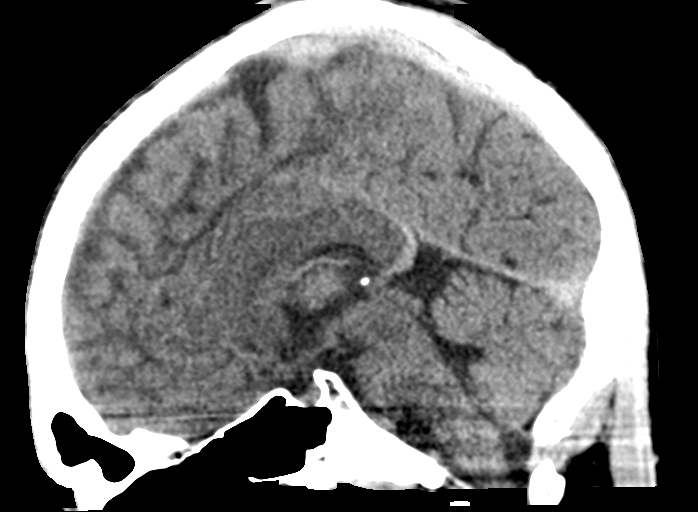
[im 41/62  brain]
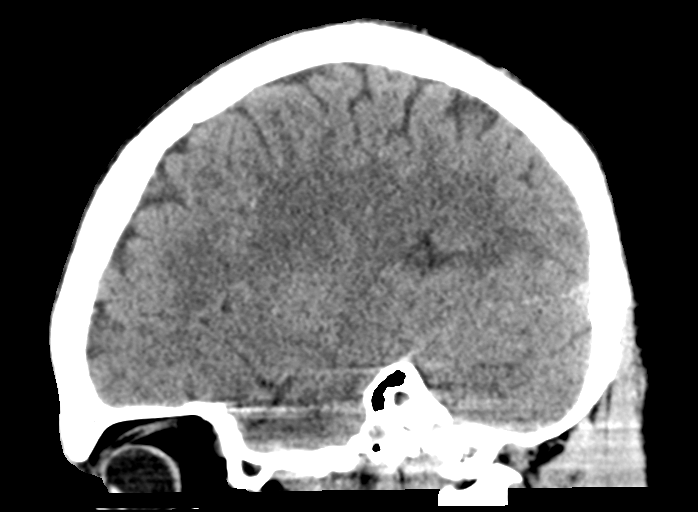

[15 of 47 positions shown; findings below may reference images not displayed]

FINDINGS: Brain: No evidence of acute infarction, hemorrhage, hydrocephalus,
extra-axial collection or mass lesion/mass effect.

Vascular: No hyperdense vessel or unexpected calcification.

Skull: Normal. Negative for fracture or focal lesion.

Sinuses/Orbits: No acute finding.

Other: None
IMPRESSION: No acute intracranial abnormalities. Normal brain.

## 2021-10-17 DIAGNOSIS — F3112 Bipolar disorder, current episode manic without psychotic features, moderate: Secondary | ICD-10-CM | POA: Diagnosis not present

## 2022-01-02 DIAGNOSIS — R002 Palpitations: Secondary | ICD-10-CM | POA: Diagnosis not present

## 2022-01-02 DIAGNOSIS — Z0000 Encounter for general adult medical examination without abnormal findings: Secondary | ICD-10-CM | POA: Diagnosis not present

## 2022-04-28 ENCOUNTER — Ambulatory Visit (INDEPENDENT_AMBULATORY_CARE_PROVIDER_SITE_OTHER): Payer: BC Managed Care – PPO | Admitting: Family Medicine

## 2022-04-28 VITALS — BP 162/108 | Ht 68.0 in | Wt 185.0 lb

## 2022-04-28 DIAGNOSIS — Q7230 Congenital absence of unspecified foot and toe(s): Secondary | ICD-10-CM

## 2022-04-28 DIAGNOSIS — Q6650 Congenital pes planus, unspecified foot: Secondary | ICD-10-CM

## 2022-04-28 DIAGNOSIS — R269 Unspecified abnormalities of gait and mobility: Secondary | ICD-10-CM

## 2022-04-29 ENCOUNTER — Encounter: Payer: Self-pay | Admitting: Family Medicine

## 2022-04-29 NOTE — Progress Notes (Signed)
PCP: Creola Corn, MD  Subjective:   HPI: Patient is a 26 y.o. male here for custom orthotics.  Patient has been using sports insoles and replacing these on his own for past several years. 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 About 2.5 months ago he inverted left ankle and this has very slowly been improving - still unable to run but working out regularly. Interested in getting custom orthotics now.  Past Medical History:  Diagnosis Date   Bipolar disorder (HCC)    Pneumonia     Current Outpatient Medications on File Prior to Visit  Medication Sig Dispense Refill   divalproex (DEPAKOTE ER) 250 MG 24 hr tablet Take 3 tablets (750 mg total) by mouth daily. (Patient not taking: Reported on 04/28/2022) 30 tablet 0   hydrOXYzine (ATARAX/VISTARIL) 25 MG tablet Take 1 tablet (25 mg total) by mouth 3 (three) times daily as needed for anxiety. (Patient not taking: Reported on 04/28/2022) 30 tablet 0   OLANZapine (ZYPREXA) 7.5 MG tablet Take 1 tablet (7.5 mg total) by mouth at bedtime. (Patient not taking: Reported on 04/28/2022) 30 tablet 0   traZODone (DESYREL) 50 MG tablet Take 1 tablet (50 mg total) by mouth at bedtime as needed for sleep. (Patient not taking: Reported on 04/28/2022) 15 tablet 0   No current facility-administered medications on file prior to visit.    Past Surgical History:  Procedure Laterality Date   WISDOM TOOTH EXTRACTION      Allergies  Allergen Reactions   Fish Allergy Hives   Other Hives and Itching    "Sea Bass"    BP (!) 162/108   Ht 5\' 8"  (1.727 m)   Wt 185 lb (83.9 kg)   BMI 28.13 kg/m      04/28/2022    2:40 PM  [facility]  Frequency of aerobic exercise (# of days/week) 6  Average time in minutes 20  Frequency of strengthening activities (# of days/week) 5        No data to display              Objective:  Physical Exam:  Gen: NAD, comfortable in exam room  Left  foot/ankle: Slightly smaller than right foot.  Absence 5th digit.  Mod overpronation.  Rotation of 4th digit with mild transverse arch collapse.  No other gross deformity, swelling, ecchymoses FROM ankle. TTP over ATFL.  No other tenderness. Trace ant drawer and 1+ talar tilt.   Negative syndesmotic compression. Thompsons test negative. NV intact distally.  Right foot/ankle: Mod overpronation.  Rotation of 5th digit with mod transverse arch collapse.  No other gross deformity, swelling, ecchymoses FROM ankle No TTP  NV intact distally.  No leg length discrepancy.   Assessment & Plan:  1. Gait abnormality - with overpronation, absence left 5th digit.  Custom orthotics made today and felt comfortable.    Patient was fitted for a : standard, cushioned, semi-rigid orthotic. The orthotic was heated and afterward the patient stood on the orthotic blank positioned on the orthotic stand. The patient was positioned in subtalar neutral position and 10 degrees of ankle dorsiflexion in a weight bearing stance. After completion of molding, a stable base was applied to the orthotic blank. The blank was ground to a stable position for weight bearing. Size: 11 Base: blue med density eva Posting: none Additional orthotic padding: none

## 2022-06-05 DIAGNOSIS — F3112 Bipolar disorder, current episode manic without psychotic features, moderate: Secondary | ICD-10-CM | POA: Diagnosis not present

## 2022-06-12 DIAGNOSIS — S93402A Sprain of unspecified ligament of left ankle, initial encounter: Secondary | ICD-10-CM | POA: Diagnosis not present

## 2022-06-24 ENCOUNTER — Other Ambulatory Visit: Payer: Self-pay

## 2022-06-24 ENCOUNTER — Encounter (HOSPITAL_BASED_OUTPATIENT_CLINIC_OR_DEPARTMENT_OTHER): Payer: Self-pay

## 2022-06-24 DIAGNOSIS — Z5321 Procedure and treatment not carried out due to patient leaving prior to being seen by health care provider: Secondary | ICD-10-CM | POA: Insufficient documentation

## 2022-06-24 DIAGNOSIS — H5789 Other specified disorders of eye and adnexa: Secondary | ICD-10-CM | POA: Insufficient documentation

## 2022-06-24 NOTE — ED Triage Notes (Signed)
Patient here POV from Home.  Endorses having a Small piece of Broken Glass from a Drinking Glass break into his Left Eye approximately 1.5 hours ago.   No Bleeding Noted. No Visual Aids. No Changes in Vision.  NAD Noted during Triage. A&Ox4. GCS 15. Ambulatory.

## 2022-06-25 ENCOUNTER — Emergency Department (HOSPITAL_BASED_OUTPATIENT_CLINIC_OR_DEPARTMENT_OTHER)
Admission: EM | Admit: 2022-06-25 | Discharge: 2022-06-25 | Discharge status: Against Medical Advice | Payer: BC Managed Care – PPO | Attending: Emergency Medicine | Admitting: Emergency Medicine

## 2022-06-25 DIAGNOSIS — T1512XA Foreign body in conjunctival sac, left eye, initial encounter: Secondary | ICD-10-CM | POA: Diagnosis not present

## 2022-06-25 DIAGNOSIS — H5712 Ocular pain, left eye: Secondary | ICD-10-CM | POA: Diagnosis not present

## 2022-06-25 NOTE — ED Notes (Signed)
Called in WR, no answer

## 2022-06-25 NOTE — ED Notes (Signed)
Called in WR, no answer, 3rd call 

## 2022-06-25 NOTE — ED Notes (Addendum)
Called x 2 no answer in WR

## 2022-06-26 DIAGNOSIS — T1512XD Foreign body in conjunctival sac, left eye, subsequent encounter: Secondary | ICD-10-CM | POA: Diagnosis not present

## 2022-07-23 ENCOUNTER — Ambulatory Visit (INDEPENDENT_AMBULATORY_CARE_PROVIDER_SITE_OTHER): Payer: BC Managed Care – PPO | Admitting: Orthopaedic Surgery

## 2022-07-23 ENCOUNTER — Encounter: Payer: Self-pay | Admitting: Orthopaedic Surgery

## 2022-07-23 ENCOUNTER — Ambulatory Visit (INDEPENDENT_AMBULATORY_CARE_PROVIDER_SITE_OTHER): Payer: BC Managed Care – PPO

## 2022-07-23 VITALS — Ht 69.0 in | Wt 190.0 lb

## 2022-07-23 DIAGNOSIS — M25572 Pain in left ankle and joints of left foot: Secondary | ICD-10-CM

## 2022-07-23 NOTE — Progress Notes (Signed)
Office Visit Note   Patient: Keith Guerrero           Date of Birth: 07/29/1996           MRN: [id number] Visit Date: 07/23/2022              Requested by: Creola Corn, MD [address] [place],  [place] [address] PCP: Creola Corn, MD   Assessment & Plan: Visit Diagnoses:  1. Pain in left ankle and joints of left foot     Plan: Keith Guerrero is a pleasant 26 year old gentleman with a history of lateral left ankle pain and subluxation which has been more acute in the last couple months.  He explains he did have an inversion injury back in March at which time he believes he injured his ATFL.  Over time this got better.  Unfortunately he has developed pain and popping over the lateral aspect of his ankle.  This is not improved.  Examination concerning for subluxation of peroneal tendon.  Injury to the retinaculum.  He has a congenital absence of his fifth toe may have also a shallow fibular groove.  he thinks this may contribute to being injury prone on his left ankle.  Will order an MRI to evaluate peroneal tendons and retinaculum.  Follow-Up Instructions: Return After MRI scan left ankle.   Orders:  Orders Placed This Encounter  Procedures   XR Ankle Complete Left   MR Ankle Left w/o contrast   No orders of the defined types were placed in this encounter.     Procedures: No procedures performed   Clinical Data: No additional findings.   Subjective: Chief Complaint  Patient presents with   Left Ankle - New Patient (Initial Visit)   Patient presents today as a new patient for left ankle pain. He states that he first noticed his ankle pain in march of 2023 when he sprained his ATFL and then then injured his CFL in 05/2022. 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 Patient is currently not completing any physical therapy at the time.   Review of Systems  All other systems reviewed and are negative.    Objective: Vital Signs: Ht  5\' 9"  (1.753 m)   Wt 190 lb (86.2 kg)   BMI 28.06 kg/m   Physical Exam Constitutional:      Appearance: Normal appearance.  Pulmonary:     Effort: Pulmonary effort is normal.  Neurological:     Mental Status: He is alert.     Ortho Exam Examination of his left ankle he has palpable dorsalis pedis pulse.  No redness no swelling except for a small amount laterally.  Congenital absence of fifth digit.  No tenderness medially with standing he does have some some pes planus.  He has good dorsiflexion plantarflexion eversion and inversion.  Nontender really over the ATFL.  He does have some tenderness over the peroneal tendon and with motion has some what appears to be some subluxation of the tendon.  Reduces back in the fibular groove easily. Specialty Comments:  No specialty comments available.  Imaging: XR Ankle Complete Left  Result Date: 07/23/2022 Radiographs of his left ankle were obtained today in multiple projections.  He has a doming of the talar dome most likely related to his congenital deformity of this foot.  Also missing fifth toe.  No degenerative changes or osseous injuries are noted.  No acute changes    PMFS  History: Patient Active Problem List   Diagnosis Date Noted   Bipolar affective disorder, manic, severe, with psychotic behavior (HCC) 10/06/2020   Delusional disorder (HCC) 08/04/2020   Intermittent explosive disorder 08/04/2020   Anxiety 03/22/2015   LEG LEGNTH DISCREPANCY 04/13/2007   PES PLANUS, CONGENITAL 04/13/2007   Congenital absence of fifth toe 04/13/2007   Past Medical History:  Diagnosis Date   Bipolar disorder (HCC)    Pneumonia     Family History  Problem Relation Age of Onset   Healthy Mother    Healthy Father    Hypertension Other     Past Surgical History:  Procedure Laterality Date   WISDOM TOOTH EXTRACTION     Social History   Occupational History   Not on file  Tobacco Use   Smoking status: Never   Smokeless tobacco: Never   Vaping Use   Vaping Use: Never used  Substance and Sexual Activity   Alcohol use: No   Drug use: No   Sexual activity: Never

## 2022-08-18 DIAGNOSIS — M19072 Primary osteoarthritis, left ankle and foot: Secondary | ICD-10-CM | POA: Diagnosis not present

## 2022-08-18 DIAGNOSIS — M24672 Ankylosis, left ankle: Secondary | ICD-10-CM | POA: Diagnosis not present

## 2022-08-18 DIAGNOSIS — M65872 Other synovitis and tenosynovitis, left ankle and foot: Secondary | ICD-10-CM | POA: Diagnosis not present

## 2022-08-18 DIAGNOSIS — M21072 Valgus deformity, not elsewhere classified, left ankle: Secondary | ICD-10-CM | POA: Diagnosis not present

## 2022-08-26 ENCOUNTER — Ambulatory Visit (INDEPENDENT_AMBULATORY_CARE_PROVIDER_SITE_OTHER): Payer: BC Managed Care – PPO | Admitting: Orthopedic Surgery

## 2022-08-26 DIAGNOSIS — S93332A Other subluxation of left foot, initial encounter: Secondary | ICD-10-CM

## 2022-08-29 ENCOUNTER — Encounter: Payer: Self-pay | Admitting: Orthopedic Surgery

## 2022-08-29 NOTE — Progress Notes (Signed)
Office Visit Note   Patient: Keith Guerrero           Date of Birth: [birth year]           MRN: [id number] Visit Date: 08/26/2022              Requested by: Creola Corn, MD [address] [place],  [place] [address] PCP: Creola Corn, MD  Chief Complaint  Patient presents with   Left Ankle - Pain      HPI: Patient is a 26 year old gentleman who is seen in referral from Dr. Cleophas Dunker.  Patient has had an MRI scan of his left ankle.  Patient states he had a inversion injury of his ankle in March.  Patient states he thinks he injured his anterior talofibular ligament.  Patient states he has had pain and popping over the lateral ankle.  Assessment & Plan: Visit Diagnoses:  1. Subluxation of peroneal tendon of left foot     Plan: Patient was given instructions and demonstrated isometric strengthening.  Discussed that if he still has subluxing symptoms of the peroneal tendons we could proceed with surgery to debride peroneal tendons possibly reinforce the peroneus brevis with the peroneus longus and capsular reefing.  Follow-Up Instructions: Return if symptoms worsen or fail to improve.   Ortho Exam  Patient is alert, oriented, no adenopathy, well-dressed, normal affect, normal respiratory effort. Examination patient has a good pulse he has a stable anterior drawer with no laxity of the anterior talofibular ligament the deltoid and posterior tibial tendon are nontender to palpation.  Patient has good eversion strength with good strength of the peroneus brevis however he does sublux the peroneal tendons with resisted eversion.  Review of the MRI scan does show tenosynovitis of the peroneal tendons and does show a previous tear of the anterior talofibular ligament however clinically he does not have any laxity.  Imaging: No results found. No images are attached to the encounter.  Labs: Lab Results  Component Value Date   HGBA1C 5.0 08/05/2020   CRP 5.2 (H) 05/18/2014     Lab  Results  Component Value Date   ALBUMIN 5.7 (H) 10/03/2020   ALBUMIN 5.2 (H) 08/03/2020   ALBUMIN 4.8 03/29/2017    No results found for: "MG" No results found for: "VD25OH"  No results found for: "PREALBUMIN"    Latest Ref Rng & Units 10/03/2020    8:25 PM 08/03/2020    3:31 PM 03/29/2017   12:08 PM  CBC EXTENDED  WBC 4.0 - 10.5 K/uL 10.7  8.0  8.0   RBC 4.22 - 5.81 MIL/uL 5.84  5.17  5.38   Hemoglobin 13.0 - 17.0 g/dL 58.0  99.8  33.8   HCT 39.0 - 52.0 % 50.7  45.4  45.6   Platelets 150 - 400 K/uL 253  198  177      There is no height or weight on file to calculate BMI.  Orders:  No orders of the defined types were placed in this encounter.  No orders of the defined types were placed in this encounter.    Procedures: No procedures performed  Clinical Data: No additional findings.  ROS:  All other systems negative, except as noted in the HPI. Review of Systems  Objective: Vital Signs: There were no vitals taken for this visit.  Specialty Comments:  No specialty comments available.  PMFS History: Patient Active Problem List   Diagnosis Date Noted   Bipolar affective disorder, manic, severe, with psychotic  behavior ([place]) 10/06/2020   Delusional disorder ([place]) 08/04/2020   Intermittent explosive disorder 08/04/2020   Anxiety 03/22/2015   LEG LEGNTH DISCREPANCY 04/13/2007   PES PLANUS, CONGENITAL 04/13/2007   Congenital absence of fifth toe 04/13/2007   Past Medical History:  Diagnosis Date   Bipolar disorder ([place])    Pneumonia     Family History  Problem Relation Age of Onset   Healthy Mother    Healthy Father    Hypertension Other     Past Surgical History:  Procedure Laterality Date   WISDOM TOOTH EXTRACTION     Social History   Occupational History   Not on file  Tobacco Use   Smoking status: Never   Smokeless tobacco: Never  Vaping Use   Vaping Use: Never used  Substance and Sexual Activity   Alcohol use: No   Drug use: No    Sexual activity: Never

## 2022-09-19 DIAGNOSIS — F3112 Bipolar disorder, current episode manic without psychotic features, moderate: Secondary | ICD-10-CM | POA: Diagnosis not present

## 2022-10-13 DIAGNOSIS — F3112 Bipolar disorder, current episode manic without psychotic features, moderate: Secondary | ICD-10-CM | POA: Diagnosis not present

## 2022-12-25 DIAGNOSIS — S3991XA Unspecified injury of abdomen, initial encounter: Secondary | ICD-10-CM | POA: Diagnosis not present

## 2023-01-08 DIAGNOSIS — E559 Vitamin D deficiency, unspecified: Secondary | ICD-10-CM | POA: Diagnosis not present

## 2023-01-08 DIAGNOSIS — N501 Vascular disorders of male genital organs: Secondary | ICD-10-CM | POA: Diagnosis not present

## 2023-01-08 DIAGNOSIS — R002 Palpitations: Secondary | ICD-10-CM | POA: Diagnosis not present

## 2023-01-09 DIAGNOSIS — Z1283 Encounter for screening for malignant neoplasm of skin: Secondary | ICD-10-CM | POA: Diagnosis not present

## 2023-01-09 DIAGNOSIS — D225 Melanocytic nevi of trunk: Secondary | ICD-10-CM | POA: Diagnosis not present

## 2023-01-15 DIAGNOSIS — Z1339 Encounter for screening examination for other mental health and behavioral disorders: Secondary | ICD-10-CM | POA: Diagnosis not present

## 2023-01-15 DIAGNOSIS — Z1331 Encounter for screening for depression: Secondary | ICD-10-CM | POA: Diagnosis not present

## 2023-01-15 DIAGNOSIS — F410 Panic disorder [episodic paroxysmal anxiety] without agoraphobia: Secondary | ICD-10-CM | POA: Diagnosis not present

## 2023-01-15 DIAGNOSIS — R198 Other specified symptoms and signs involving the digestive system and abdomen: Secondary | ICD-10-CM | POA: Diagnosis not present

## 2023-01-15 DIAGNOSIS — F419 Anxiety disorder, unspecified: Secondary | ICD-10-CM | POA: Diagnosis not present

## 2023-01-15 DIAGNOSIS — Z0000 Encounter for general adult medical examination without abnormal findings: Secondary | ICD-10-CM | POA: Diagnosis not present

## 2023-01-15 DIAGNOSIS — R002 Palpitations: Secondary | ICD-10-CM | POA: Diagnosis not present

## 2023-02-10 DIAGNOSIS — S0501XA Injury of conjunctiva and corneal abrasion without foreign body, right eye, initial encounter: Secondary | ICD-10-CM | POA: Diagnosis not present

## 2023-07-08 ENCOUNTER — Encounter: Payer: BC Managed Care – PPO | Admitting: Family Medicine

## 2023-10-21 DIAGNOSIS — F3112 Bipolar disorder, current episode manic without psychotic features, moderate: Secondary | ICD-10-CM | POA: Diagnosis not present

## 2023-11-02 DIAGNOSIS — M791 Myalgia, unspecified site: Secondary | ICD-10-CM | POA: Diagnosis not present

## 2023-11-06 DIAGNOSIS — R0781 Pleurodynia: Secondary | ICD-10-CM | POA: Diagnosis not present

## 2023-11-06 DIAGNOSIS — N390 Urinary tract infection, site not specified: Secondary | ICD-10-CM | POA: Diagnosis not present

## 2023-11-06 DIAGNOSIS — R634 Abnormal weight loss: Secondary | ICD-10-CM | POA: Diagnosis not present

## 2023-11-06 DIAGNOSIS — M940 Chondrocostal junction syndrome [Tietze]: Secondary | ICD-10-CM | POA: Diagnosis not present

## 2023-11-12 ENCOUNTER — Ambulatory Visit: Payer: BC Managed Care – PPO

## 2023-11-12 ENCOUNTER — Other Ambulatory Visit: Payer: Self-pay

## 2023-11-12 ENCOUNTER — Encounter: Payer: Self-pay | Admitting: Podiatry

## 2023-11-12 ENCOUNTER — Ambulatory Visit: Payer: Self-pay | Admitting: Podiatry

## 2023-11-12 DIAGNOSIS — Q7232 Congenital absence of left foot and toe(s): Secondary | ICD-10-CM

## 2023-11-12 DIAGNOSIS — M778 Other enthesopathies, not elsewhere classified: Secondary | ICD-10-CM

## 2023-11-12 DIAGNOSIS — M79672 Pain in left foot: Secondary | ICD-10-CM

## 2023-11-12 DIAGNOSIS — M79671 Pain in right foot: Secondary | ICD-10-CM | POA: Diagnosis not present

## 2023-11-12 NOTE — Progress Notes (Signed)
  Subjective:  Patient ID: Keith Guerrero, male    DOB: [birth year],   MRN: [id number]  Chief Complaint  Patient presents with   Toe Pain    27 y.o. male presents for concern of bilateral foot and toe pain. Relates two and a half weeks ago he started getting swelling redness and pain in mulitple toes on both of his feet. Relates great toes and fourth toe on right and fifth on left. Does have congenital loss of ray on his left foot. Relates since the swelling and inflammation have calmed down. Does relate a history of sardines for a long period of time. Has since stopped this and had improvement. He did have uric acid done with PCP that was within normal limits.   . Denies any other pedal complaints. Denies n/v/f/c.   Past Medical History:  Diagnosis Date   Bipolar disorder (HCC)    Pneumonia     Objective:  Physical Exam: Vascular: DP/PT pulses 2/4 bilateral. CFT <3 seconds. Normal hair growth on digits. No edema.  Skin. No lacerations or abrasions bilateral feet.  Musculoskeletal: MMT 5/5 bilateral lower extremities in DF, PF, Inversion and Eversion. Deceased ROM in DF of ankle joint. Congenital abscess of what appears to be third ray of foot. No pain to palpation currently No erythema. Mild edema to left fifth digit.  Neurological: Sensation intact to light touch.   Assessment:   1. Capsulitis of foot   2. Congenital absence of toe of left foot      Plan:  Patient was evaluated and treated and all questions answered. -Xrays reviewed. No acute fracture or dislocations noted. Left foot with congential abscess of metatarsal ray and toe.  -Discussed treatement options for gouty arthritis and gout education provided. -injeciton deferred today.  -Discussed other etiologies including bug bit, dermatitis vs other arthropathy/autoimmune changes.  -Uric acid wnl.  -Advised to continue diet changes.  -Patient to return as needed for possible flares in future.    Louann Sjogren, DPM

## 2023-12-16 DIAGNOSIS — F3112 Bipolar disorder, current episode manic without psychotic features, moderate: Secondary | ICD-10-CM | POA: Diagnosis not present

## 2024-01-14 DIAGNOSIS — Z0000 Encounter for general adult medical examination without abnormal findings: Secondary | ICD-10-CM | POA: Diagnosis not present

## 2024-01-14 DIAGNOSIS — Z0189 Encounter for other specified special examinations: Secondary | ICD-10-CM | POA: Diagnosis not present

## 2024-01-14 DIAGNOSIS — R82998 Other abnormal findings in urine: Secondary | ICD-10-CM | POA: Diagnosis not present

## 2024-01-14 DIAGNOSIS — E559 Vitamin D deficiency, unspecified: Secondary | ICD-10-CM | POA: Diagnosis not present

## 2024-01-19 DIAGNOSIS — F3112 Bipolar disorder, current episode manic without psychotic features, moderate: Secondary | ICD-10-CM | POA: Diagnosis not present

## 2024-01-21 DIAGNOSIS — E559 Vitamin D deficiency, unspecified: Secondary | ICD-10-CM | POA: Diagnosis not present

## 2024-01-21 DIAGNOSIS — Z1331 Encounter for screening for depression: Secondary | ICD-10-CM | POA: Diagnosis not present

## 2024-01-21 DIAGNOSIS — Z0000 Encounter for general adult medical examination without abnormal findings: Secondary | ICD-10-CM | POA: Diagnosis not present

## 2024-02-18 DIAGNOSIS — F3112 Bipolar disorder, current episode manic without psychotic features, moderate: Secondary | ICD-10-CM | POA: Diagnosis not present
